# Patient Record
Sex: Male | Born: 1950 | Race: Asian | Hispanic: No | Marital: Married | Smoking: Never smoker
Health system: Southern US, Community
[De-identification: ages and names within clinical notes are randomized; demographics above are authoritative.]

## PROBLEM LIST (undated history)

## (undated) HISTORY — PX: BRAIN SURGERY: SHX531

---

## 1999-03-02 ENCOUNTER — Encounter: Payer: Self-pay | Admitting: Gastroenterology

## 1999-03-02 ENCOUNTER — Ambulatory Visit (HOSPITAL_COMMUNITY): Admission: RE | Admit: 1999-03-02 | Discharge: 1999-03-02 | Payer: Self-pay | Admitting: Gastroenterology

## 2013-02-21 ENCOUNTER — Other Ambulatory Visit: Payer: Self-pay | Admitting: Family Medicine

## 2013-02-21 DIAGNOSIS — M549 Dorsalgia, unspecified: Secondary | ICD-10-CM

## 2013-03-02 ENCOUNTER — Ambulatory Visit
Admission: RE | Admit: 2013-03-02 | Discharge: 2013-03-02 | Disposition: A | Discharge status: Home / Self Care | Payer: PRIVATE HEALTH INSURANCE | Source: Ambulatory Visit | Attending: Family Medicine | Admitting: Family Medicine

## 2013-03-02 VITALS — BP 171/103 | HR 60

## 2013-03-02 DIAGNOSIS — M549 Dorsalgia, unspecified: Secondary | ICD-10-CM

## 2013-03-02 MED ORDER — METHYLPREDNISOLONE ACETATE 40 MG/ML INJ SUSP (RADIOLOG
120.0000 mg | Freq: Once | INTRAMUSCULAR | Status: AC
  Administered 2013-03-02: 120 mg via EPIDURAL

## 2013-03-02 MED ORDER — IOHEXOL 180 MG/ML  SOLN
1.0000 mL | Freq: Once | INTRAMUSCULAR | Status: AC | PRN
  Administered 2013-03-02: 1 mL via EPIDURAL

## 2013-04-26 ENCOUNTER — Other Ambulatory Visit: Payer: Self-pay | Admitting: Family Medicine

## 2013-04-26 DIAGNOSIS — M549 Dorsalgia, unspecified: Secondary | ICD-10-CM

## 2013-05-04 ENCOUNTER — Ambulatory Visit
Admission: RE | Admit: 2013-05-04 | Discharge: 2013-05-04 | Disposition: A | Discharge status: Home / Self Care | Payer: PRIVATE HEALTH INSURANCE | Source: Ambulatory Visit | Attending: Family Medicine | Admitting: Family Medicine

## 2013-05-04 DIAGNOSIS — M549 Dorsalgia, unspecified: Secondary | ICD-10-CM

## 2013-05-04 MED ORDER — IOHEXOL 180 MG/ML  SOLN
1.0000 mL | Freq: Once | INTRAMUSCULAR | Status: AC | PRN
  Administered 2013-05-04: 1 mL via EPIDURAL

## 2013-05-04 MED ORDER — METHYLPREDNISOLONE ACETATE 40 MG/ML INJ SUSP (RADIOLOG
120.0000 mg | Freq: Once | INTRAMUSCULAR | Status: AC
  Administered 2013-05-04: 120 mg via EPIDURAL

## 2014-04-15 ENCOUNTER — Ambulatory Visit (INDEPENDENT_AMBULATORY_CARE_PROVIDER_SITE_OTHER): Payer: PRIVATE HEALTH INSURANCE | Admitting: Emergency Medicine

## 2014-04-15 ENCOUNTER — Ambulatory Visit: Payer: PRIVATE HEALTH INSURANCE

## 2014-04-15 VITALS — BP 144/99 | HR 83 | Temp 98.3°F | Resp 16 | Ht 68.0 in | Wt 185.8 lb

## 2014-04-15 DIAGNOSIS — R059 Cough, unspecified: Secondary | ICD-10-CM

## 2014-04-15 DIAGNOSIS — R05 Cough: Secondary | ICD-10-CM

## 2014-04-15 DIAGNOSIS — J209 Acute bronchitis, unspecified: Secondary | ICD-10-CM

## 2014-04-15 MED ORDER — PROMETHAZINE-CODEINE 6.25-10 MG/5ML PO SYRP: 5.0000 mL | ORAL_SOLUTION | Freq: Four times a day (QID) | ORAL | Status: DC | PRN

## 2014-04-15 MED ORDER — AZITHROMYCIN 250 MG PO TABS
ORAL_TABLET | ORAL | Status: DC
Start: 2014-04-15 — End: 2014-05-24

## 2014-04-15 NOTE — Progress Notes (Signed)
[facility] [address], [place] [address] [phone]  Date:  04/15/2014   Name:  Blake Wallace   DOB:  02/18/1951   MRN:  [id number]  PCP:  No primary provider on file.    Chief Complaint: Cough   History of Present Illness:  Blake Wallace is a 63 y.o. very pleasant male patient who presents with the following:  3 week history of cough productive mucopurulent sputum. No wheezing or shortness of breath.  No hemoptysis or history of TB.  Non smoke..  No nausea or vomiting.  No stool change.  No rash.  No coryza or sore throat.  .No improvement with over the counter medications or other home remedies. Denies other complaint or health concern today.   There are no active problems to display for this patient.   History reviewed. No pertinent past medical history.  Past Surgical History  Procedure Laterality Date  . Brain surgery      History  Substance Use Topics  . Smoking status: Never Smoker   . Smokeless tobacco: Not on file  . Alcohol Use: No    Family History  Problem Relation Age of Onset  . Family history unknown: Yes    No Known Allergies  Medication list has been reviewed and updated.  No current outpatient prescriptions on file prior to visit.   No current facility-administered medications on file prior to visit.    Review of Systems:  As per HPI, otherwise negative.    Physical Examination: Filed Vitals:   04/15/14 1811  BP: 144/99  Pulse: 83  Temp: 98.3 F (36.8 C)  Resp: 16   Filed Vitals:   04/15/14 1811  Height: 5\' 8"  (1.727 m)  Weight: 185 lb 12.8 oz (84.278 kg)   Body mass index is 28.26 kg/(m^2). Ideal Body Weight: Weight in (lb) to have BMI = 25: 164.1  GEN: WDWN, NAD, Non-toxic, A & O x 3 HEENT: Atraumatic, Normocephalic. Neck supple. No masses, No LAD. Ears and Nose: No external deformity. CV: RRR, No M/G/R. No JVD. No thrill. No extra heart sounds. PULM: CTA B, no wheezes, crackles, rhonchi. No  retractions. No resp. distress. No accessory muscle use. ABD: S, NT, ND, +BS. No rebound. No HSM. EXTR: No c/c/e NEURO Normal gait.  PSYCH: Normally interactive. Conversant. Not depressed or anxious appearing.  Calm demeanor.    Assessment and Plan: Bronchitis zpak Phen c cod  Signed,  Phillips OdorJeffery Caytlin Better, MD   [facility] reading (PRIMARY) by  Dr. Dareen PianoAnderson.  Negative chest .

## 2014-04-15 NOTE — Patient Instructions (Signed)

## 2014-05-24 ENCOUNTER — Ambulatory Visit (INDEPENDENT_AMBULATORY_CARE_PROVIDER_SITE_OTHER): Payer: PRIVATE HEALTH INSURANCE

## 2014-05-24 ENCOUNTER — Ambulatory Visit (INDEPENDENT_AMBULATORY_CARE_PROVIDER_SITE_OTHER): Payer: PRIVATE HEALTH INSURANCE | Admitting: Family Medicine

## 2014-05-24 VITALS — BP 120/84 | HR 73 | Temp 97.9°F | Resp 18 | Ht 69.0 in | Wt 185.2 lb

## 2014-05-24 DIAGNOSIS — M545 Low back pain, unspecified: Secondary | ICD-10-CM

## 2014-05-24 DIAGNOSIS — K5900 Constipation, unspecified: Secondary | ICD-10-CM

## 2014-05-24 DIAGNOSIS — M79606 Pain in leg, unspecified: Secondary | ICD-10-CM

## 2014-05-24 DIAGNOSIS — M79609 Pain in unspecified limb: Secondary | ICD-10-CM

## 2014-05-24 LAB — POCT CBC
Granulocyte percent: 53.9 %G (ref 37–80)
HCT, POC: 42.7 % — AB (ref 43.5–53.7)
HEMOGLOBIN: 13.7 g/dL — AB (ref 14.1–18.1)
LYMPH, POC: 3 (ref 0.6–3.4)
MCH, POC: 28.3 pg (ref 27–31.2)
MCHC: 32.1 g/dL (ref 31.8–35.4)
MCV: 88.2 fL (ref 80–97)
MID (CBC): 0.5 (ref 0–0.9)
MPV: 8.6 fL (ref 0–99.8)
PLATELET COUNT, POC: 280 10*3/uL (ref 142–424)
POC GRANULOCYTE: 4.1 (ref 2–6.9)
POC LYMPH PERCENT: 38.9 %L (ref 10–50)
POC MID %: 7.2 % (ref 0–12)
RBC: 4.84 M/uL (ref 4.69–6.13)
RDW, POC: 13.1 %
WBC: 7.6 10*3/uL (ref 4.6–10.2)

## 2014-05-24 LAB — POCT SEDIMENTATION RATE: POCT SED RATE: 20 mm/h (ref 0–22)

## 2014-05-24 MED ORDER — DICLOFENAC SODIUM 75 MG PO TBEC: 75.0000 mg | DELAYED_RELEASE_TABLET | Freq: Two times a day (BID) | ORAL | Status: DC

## 2014-05-24 NOTE — Patient Instructions (Addendum)
Take diclofenac one twice daily for leg pain, at breakfast and supper. Do not take any ibuprofen or Aleve while you're taking the diclofenac. If he needs something more for pain you can take Tylenol.  I will let you know the results of your lab tests in a few days.  Try the medication for about 3 weeks, and if you're not doing better come back for a recheck  You have a lot of stool (poop) back up in your colon. I suggest you try taking some over-the-counter MiraLax one dose every day or every several days to try and keep your bowels cleaned out better. The pressure from the stool can make you have more discomfort in your legs.  If you're not improving we may need to send you to another back specialist.  Return sooner if problems

## 2014-05-24 NOTE — Progress Notes (Signed)
Subjective: 63 year old [place] man who has been having pain in his legs for the last 4 years or so, gradually getting worse. He hurts in both calves and both thighs. Sometimes it hurts into the feet. He hurts worse when he is on his feet all day. If he stands in one place it hurts him. The legs will hurt in the thighs and calves, then after about a half-hour they will become numb. No back injuries. He is otherwise generally a healthy man. He takes some ibuprofen or Cong[place]inese herbal medicine for his pains. He feels better when he lays down, but in the morning when he gets out of bed he has a hard time standing up. Nonsmoker.  Objective: Pleasant alert gentleman in no major acute distress. He speaks fair [place]. Abdomen soft. No CVA tenderness. No hip or back tenderness. Gait satisfactory. Calf and thighs nontender. Good pulses in his feet. No cyanosis.  Assessment: Bilateral leg pain and numbness, suspicious for spinal stenosis  Plan: He had his back injected last year twice, but only got minimal relief for a month or so.  [facility] reading (PRIMARY) by  Dr. Alwyn RenHopper Mild arthritic spurring.  Pelvis and hips normal.  No specific cause for the pain is determined yet. I suggested he take the diclofenac on a regular basis and see how he does. If not better we may need to refer to a specialist.

## 2014-05-25 LAB — CK: Total CK: 315 U/L — ABNORMAL HIGH (ref 7–232)

## 2014-05-28 ENCOUNTER — Encounter: Payer: Self-pay | Admitting: Family Medicine

## 2014-06-14 ENCOUNTER — Ambulatory Visit (INDEPENDENT_AMBULATORY_CARE_PROVIDER_SITE_OTHER): Payer: PRIVATE HEALTH INSURANCE

## 2014-06-14 ENCOUNTER — Ambulatory Visit (INDEPENDENT_AMBULATORY_CARE_PROVIDER_SITE_OTHER): Payer: PRIVATE HEALTH INSURANCE | Admitting: Family Medicine

## 2014-06-14 VITALS — BP 140/68 | HR 70 | Temp 98.1°F | Resp 18 | Ht 69.0 in | Wt 186.0 lb

## 2014-06-14 DIAGNOSIS — M25569 Pain in unspecified knee: Secondary | ICD-10-CM

## 2014-06-14 DIAGNOSIS — M25562 Pain in left knee: Secondary | ICD-10-CM

## 2014-06-14 MED ORDER — PREDNISONE 20 MG PO TABS: ORAL_TABLET | ORAL | Status: DC

## 2014-06-14 NOTE — Progress Notes (Signed)
Subjective: Patient continues to have pain in his left leg. Hurts especially when he flexes the right knee to about 135. No specific injury. Has some pain across the low back, and there is pain in his left thigh and left leg. The right leg seems to be doing better.  Objective: Pleasant gentleman in no major distress. Speaks adequate English. No CVA tenderness. Good range of motion of back. Flexion of the knee did not seem to cause a lot of pain but it passively, but he when he does it forcefully it hurts more. She describes the pain in his lateral fly and lateral calf areas. Good pulses. Strength.. No atrophy. Straight leg raising test negative..     Assessment: Left leg pain  Plan: X-ray left knee  [facility] reading (PRIMARY) by  Dr. Alwyn RenHopper Normal knee  This may be a radicular pain from his back. We'll treat with tapered steroids. If symptoms persist may have to make another back referral for him. He apparently has seen someone and had an MRI in the past. I cannot access that report, and asked him to get hold of a copy of the report.

## 2014-06-14 NOTE — Patient Instructions (Signed)
Take prednisone with breakfast 3 pills daily for 2 days, then 2 daily for 2 days, then one daily for 2 days, then one half daily for 4 days.  If your symptoms continue to persist we will need to either get an MRI of your back will refer you to a back specialist.  Return if not improving over the next few weeks.

## 2014-07-12 ENCOUNTER — Ambulatory Visit (INDEPENDENT_AMBULATORY_CARE_PROVIDER_SITE_OTHER): Payer: PRIVATE HEALTH INSURANCE | Admitting: Emergency Medicine

## 2014-07-12 VITALS — BP 124/76 | HR 68 | Temp 98.0°F | Resp 18 | Ht 69.0 in | Wt 185.0 lb

## 2014-07-12 DIAGNOSIS — M5430 Sciatica, unspecified side: Secondary | ICD-10-CM

## 2014-07-12 DIAGNOSIS — M48061 Spinal stenosis, lumbar region without neurogenic claudication: Secondary | ICD-10-CM

## 2014-07-12 MED ORDER — TRAMADOL HCL 50 MG PO TABS: 50.0000 mg | ORAL_TABLET | Freq: Three times a day (TID) | ORAL | Status: DC | PRN

## 2014-07-12 NOTE — Progress Notes (Signed)
[facility] [address], [place] [address] [phone]  Date:  07/12/2014   Name:  Blake Wallace   DOB:  06/17/1951   MRN:  [id number]  PCP:  No PCP Per Patient    Chief Complaint: Follow-up and Leg Pain   History of Present Illness:  Blake Wallace is a 63 y.o. very pleasant male patient who presents with the following:  Patient seen previously and thought to have spinal stenosis.  Comes back today with office note from [facility].  Has been advised to have surgical treatment.  Patient not interested. Continues to have pain with radiation into left knee.  No weakness but has numbness in anterior thighs. No improvement with over the counter medications or other home remedies. Denies other complaint or health concern today.   There are no active problems to display for this patient.   History reviewed. No pertinent past medical history.  Past Surgical History  Procedure Laterality Date  . Brain surgery      History  Substance Use Topics  . Smoking status: Never Smoker   . Smokeless tobacco: Not on file  . Alcohol Use: No    History reviewed. No pertinent family history.  No Known Allergies  Medication list has been reviewed and updated.  Current Outpatient Prescriptions on File Prior to Visit  Medication Sig Dispense Refill  . diclofenac (VOLTAREN) 75 MG EC tablet Take 1 tablet (75 mg total) by mouth 2 (two) times daily.  40 tablet  1  . ibuprofen (ADVIL,MOTRIN) 200 MG tablet Take 200 mg by mouth every 6 (six) hours as needed.      . predniSONE (DELTASONE) 20 MG tablet Take 3 daily for 2 days, then 2 daily for 2 days, then one daily for 2 days, then one half daily for 4 days. Take with breakfast.  14 tablet  0   No current facility-administered medications on file prior to visit.    Review of Systems:  As per HPI, otherwise negative.    Physical Examination: Filed Vitals:   07/12/14 1211  BP: 124/76  Pulse: 68  Temp: 98 F (36.7  C)  Resp: 18   Filed Vitals:   07/12/14 1211  Height: 5\' 9"  (1.753 m)  Weight: 185 lb (83.915 kg)   Body mass index is 27.31 kg/(m^2). Ideal Body Weight: Weight in (lb) to have BMI = 25: 168.9   GEN: WDWN, NAD, Non-toxic, Alert & Oriented x 3 HEENT: Atraumatic, Normocephalic.  Ears and Nose: No external deformity. EXTR: No clubbing/cyanosis/edema NEURO: Normal gait.  Absent left patellar.  Normal motor strength PSYCH: Normally interactive. Conversant. Not depressed or anxious appearing.  Calm demeanor.    Assessment and Plan: Sciatic neuritis likely secondary to spinal stenosis Refer back to [facility]  ? Refer to pain management Tramadol  Signed,  Phillips OdorJeffery Rakiyah Esch, MD

## 2014-07-12 NOTE — Patient Instructions (Signed)
Spinal Stenosis Spinal stenosis is an abnormal narrowing of the canals of your spine (vertebrae). CAUSES  Spinal stenosis is caused by areas of bone pushing into the central canals of your vertebrae. This condition can be present at birth (congenital). It also may be caused by arthritic deterioration of your vertebrae (spinal degeneration).  SYMPTOMS   Pain that is generally worse with activities, particularly standing and walking.  Numbness, tingling, hot or cold sensations, weakness, or weariness in your legs.  Frequent episodes of falling.  A foot-slapping gait that leads to muscle weakness. DIAGNOSIS  Spinal stenosis is diagnosed with the use of magnetic resonance imaging (MRI) or computed tomography (CT). TREATMENT  Initial therapy for spinal stenosis focuses on the management of the pain and other symptoms associated with the condition. These therapies include:  Practicing postural changes to lessen pressure on your nerves.  Exercises to strengthen the core of your body.  Loss of excess body weight.  The use of nonsteroidal anti-inflammatory medicines to reduce swelling and inflammation in your nerves. When therapies to manage pain are not successful, surgery to treat spinal stenosis may be recommended. This surgery involves removing excess bone, which puts pressure on your nerve roots. During this surgery (laminectomy), the posterior boney arch (lamina) and excess bone around the facet joints are removed. Document Released: 02/12/2004 Document Revised: 04/08/2014 Document Reviewed: 03/02/2013 ExitCare Patient Information 2015 [employer]. This information is not intended to replace advice given to you by your health care provider. Make sure you discuss any questions you have with your health care provider.  

## 2014-08-20 IMAGING — CR DG LUMBAR SPINE COMPLETE 4+V
5 series · 5 of 5 positions shown · non-contrast
Comparison: None.

CLINICAL DATA: Chronic leg pain, worsening.

EXAM:
LUMBAR SPINE - COMPLETE 4+ VIEW

[AP]
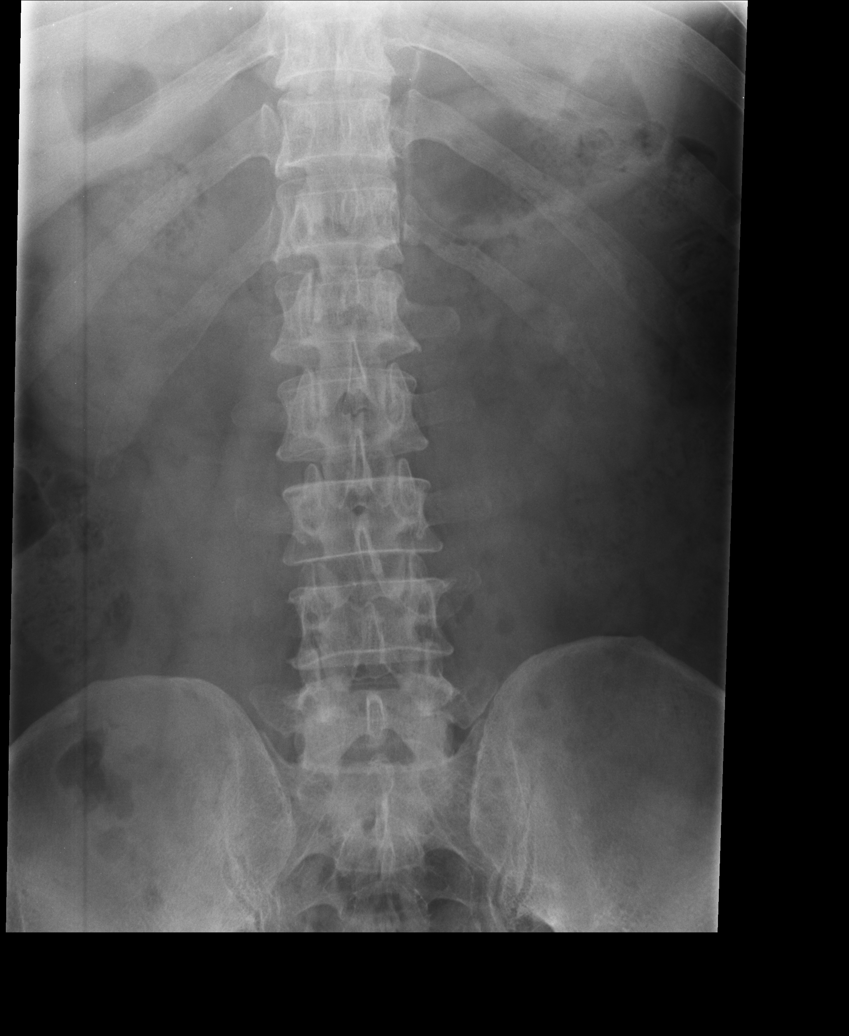

[rpo]
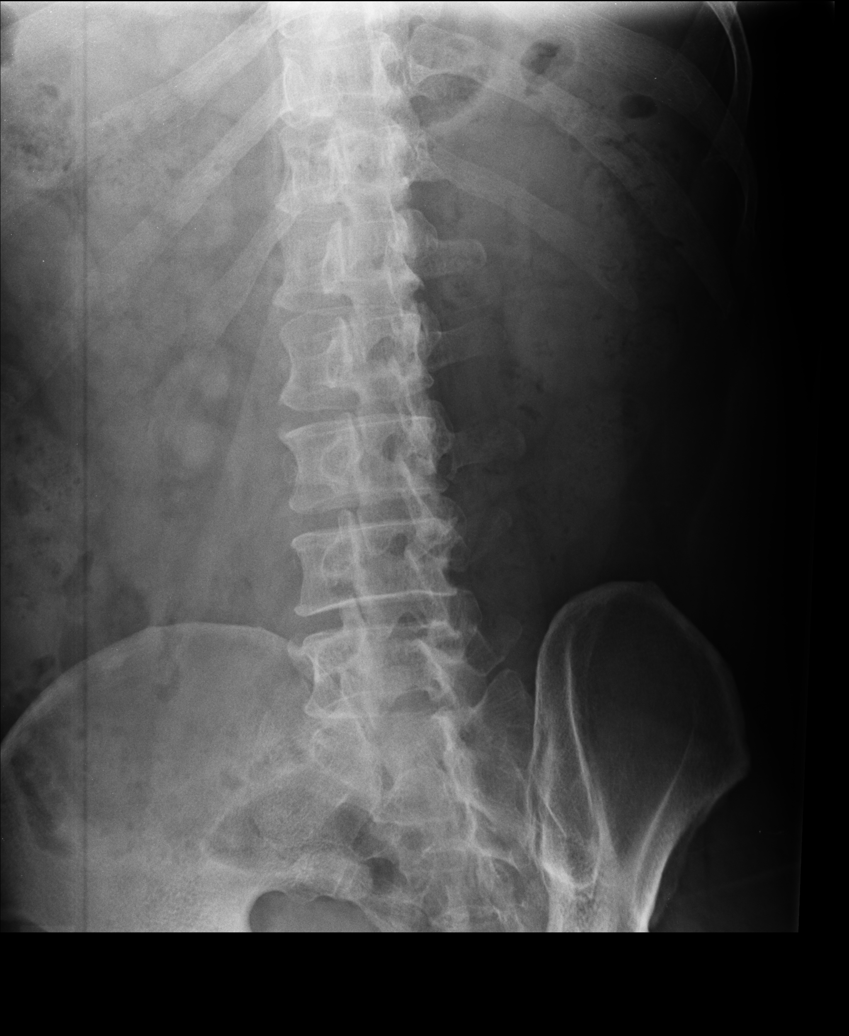

[lpo]
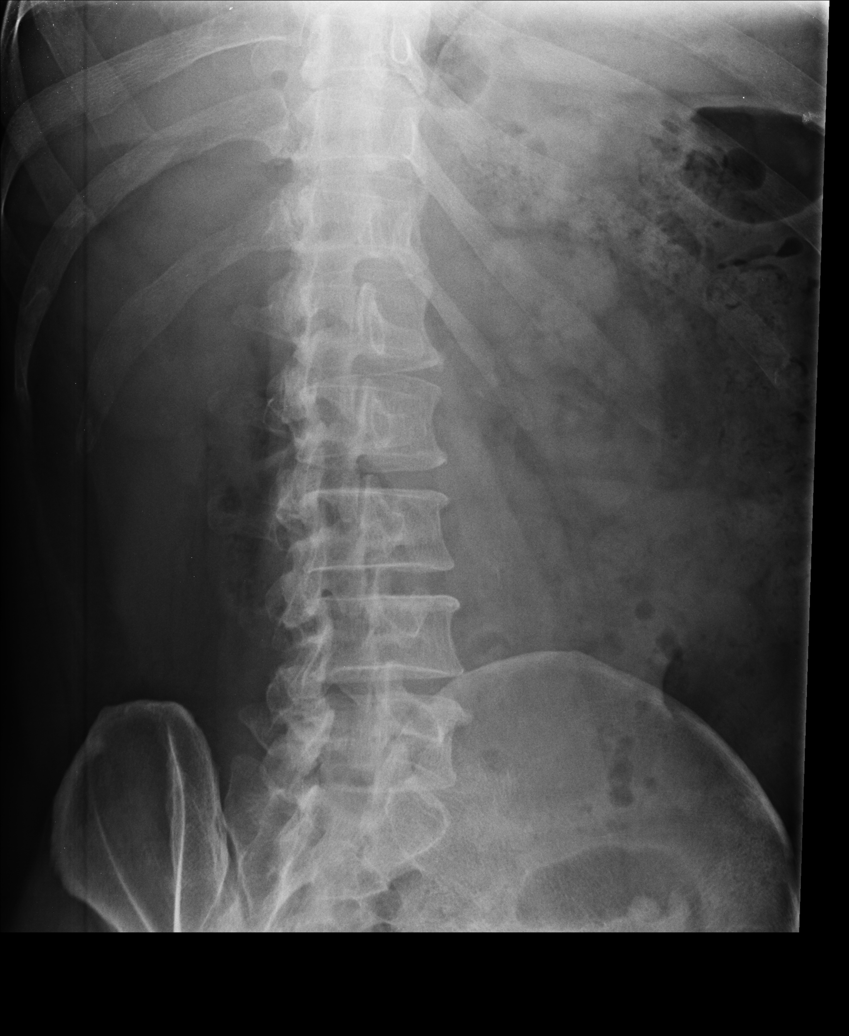

[lateral]
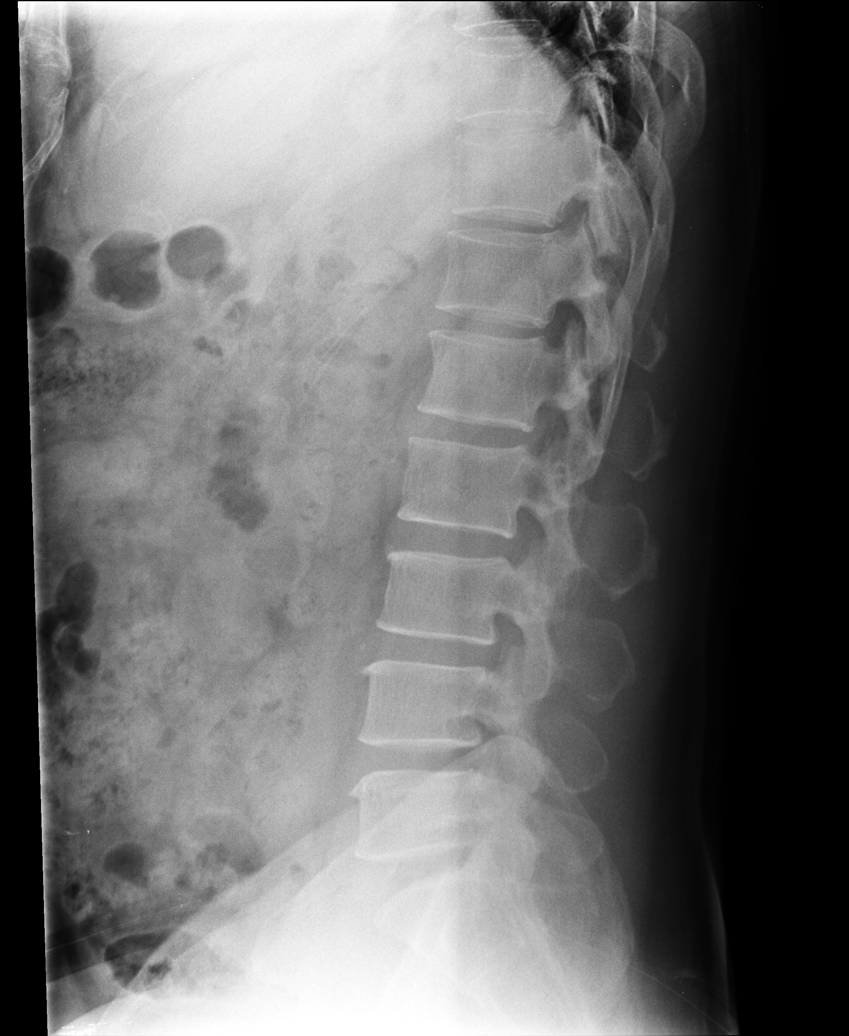

[l5 s1]
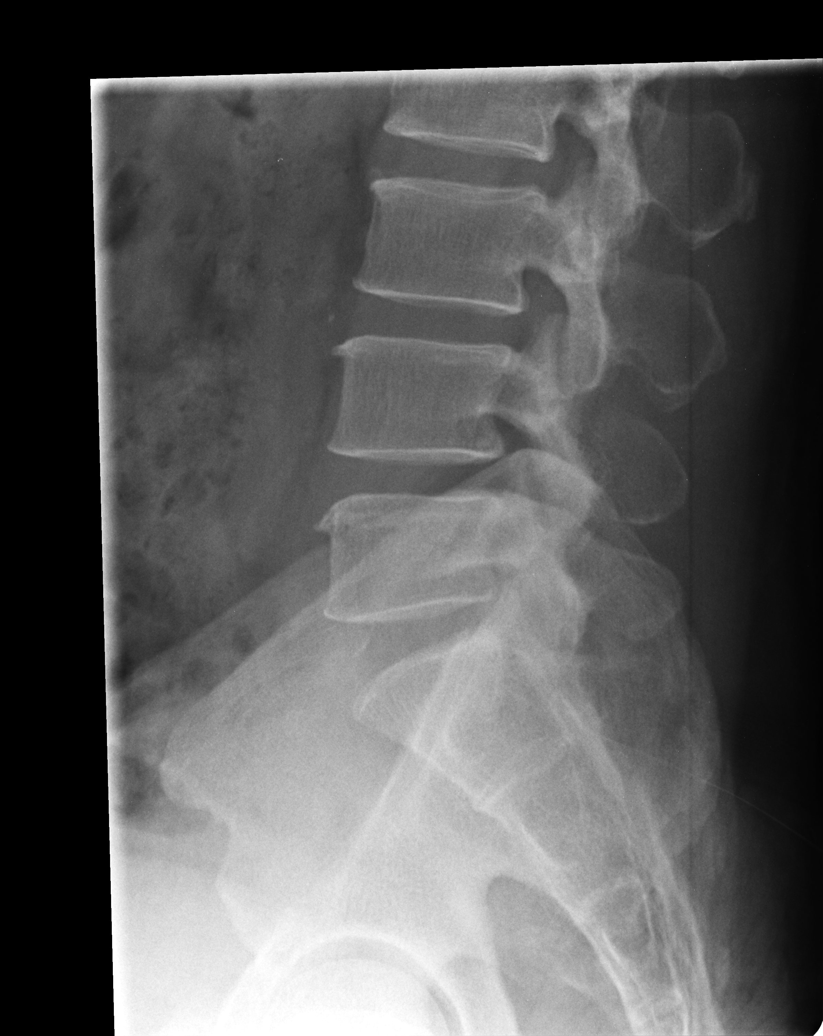

[5 of 5 positions shown; findings below may reference images not displayed]

FINDINGS: Minimal spondylosis, with spurring anterior to the vertebral body
column at L2-3, L3-4, and L4-5.

No fracture or malalignment. No additional significant abnormalities
observed.
IMPRESSION: 1. Minimal lumbar spondylosis.

## 2014-08-31 ENCOUNTER — Ambulatory Visit (INDEPENDENT_AMBULATORY_CARE_PROVIDER_SITE_OTHER): Payer: PRIVATE HEALTH INSURANCE | Admitting: Family Medicine

## 2014-08-31 VITALS — BP 128/78 | HR 63 | Temp 97.8°F | Resp 16 | Ht 69.0 in | Wt 180.0 lb

## 2014-08-31 DIAGNOSIS — M5442 Lumbago with sciatica, left side: Secondary | ICD-10-CM

## 2014-08-31 DIAGNOSIS — R059 Cough, unspecified: Secondary | ICD-10-CM

## 2014-08-31 DIAGNOSIS — J069 Acute upper respiratory infection, unspecified: Secondary | ICD-10-CM

## 2014-08-31 DIAGNOSIS — R21 Rash and other nonspecific skin eruption: Secondary | ICD-10-CM

## 2014-08-31 DIAGNOSIS — M5441 Lumbago with sciatica, right side: Secondary | ICD-10-CM

## 2014-08-31 DIAGNOSIS — R05 Cough: Secondary | ICD-10-CM

## 2014-08-31 DIAGNOSIS — M5430 Sciatica, unspecified side: Secondary | ICD-10-CM

## 2014-08-31 MED ORDER — BENZONATATE 100 MG PO CAPS: 200.0000 mg | ORAL_CAPSULE | Freq: Two times a day (BID) | ORAL | Status: DC | PRN

## 2014-08-31 MED ORDER — TRAMADOL HCL 50 MG PO TABS: 50.0000 mg | ORAL_TABLET | Freq: Two times a day (BID) | ORAL | Status: DC | PRN

## 2014-08-31 MED ORDER — TRIAMCINOLONE ACETONIDE 0.1 % EX CREA: 1.0000 "application " | TOPICAL_CREAM | Freq: Two times a day (BID) | CUTANEOUS | Status: DC

## 2014-08-31 NOTE — Progress Notes (Signed)
Chief Complaint:  Chief Complaint  Patient presents with  . Cough    x6 days; pt has an cough and he states his phlegm is dark color  . Leg Problem    pt states both of his legs are itching  . Medication Refill    TraMADol    HPI: Blake Wallace is a 63 y.o. male who is here for  1. 1 week cough with hx of sputum without blood. No fevers chills or night sweat,  Coughing up dark yellow sputum. No blood in couhg, no h/o TB. No facial pain, no ear pain, no drainage, no SOB, no CP. He has minimal pharyngitis 2. He also has had dry sckin and eczema on his legs for the last 3 weeks, has been scratching  his legs. dneies nay new meds, any new soaps, foods, travels, insect bites 3. He has chronic low back pain with sciatica,  numbness and tingling in his legs when he stands or walks tooo far, he has sciatica and LBP, he denies diabetes. No new w/n/t  Prior back exam shows: FINDINGS:  Minimal spondylosis, with spurring anterior to the vertebral body  column at L2-3, L3-4, and L4-5.  No fracture or malalignment. No additional significant abnormalities  observed.  IMPRESSION:  1. Minimal lumbar spondylosis.  Electronically Signed  By: Herbie Baltimore M.D.  On: 05/24/2014 14:44   History reviewed. No pertinent past medical history. Past Surgical History  Procedure Laterality Date  . Brain surgery     History   Social History  . Marital Status: Married    Spouse Name: N/A    Number of Children: N/A  . Years of Education: N/A   Social History Main Topics  . Smoking status: Never Smoker   . Smokeless tobacco: None  . Alcohol Use: No  . Drug Use: No  . Sexual Activity: None   Other Topics Concern  . None   Social History Narrative  . None   History reviewed. No pertinent family history. No Known Allergies Prior to Admission medications   Medication Sig Start Date End Date Taking? Authorizing Provider  diclofenac (VOLTAREN) 75 MG EC tablet Take 1 tablet (75 mg  total) by mouth 2 (two) times daily. 05/24/14  Yes Peyton Najjar, MD  ibuprofen (ADVIL,MOTRIN) 200 MG tablet Take 200 mg by mouth every 6 (six) hours as needed.   Yes Historical Provider, MD  predniSONE (DELTASONE) 20 MG tablet Take 3 daily for 2 days, then 2 daily for 2 days, then one daily for 2 days, then one half daily for 4 days. Take with breakfast. 06/14/14  Yes Peyton Najjar, MD  traMADol (ULTRAM) 50 MG tablet Take 1 tablet (50 mg total) by mouth every 8 (eight) hours as needed. 07/12/14  Yes Carmelina Dane, MD     ROS: The patient denies fevers, chills, night sweats, unintentional weight loss, chest pain, palpitations, wheezing, dyspnea on exertion, nausea, vomiting, abdominal pain, dysuria, hematuria, melena,   All other systems have been reviewed and were otherwise negative with the exception of those mentioned in the HPI and as above.    PHYSICAL EXAM: Filed Vitals:   08/31/14 0924  BP: 128/78  Pulse: 63  Temp: 97.8 F (36.6 C)  Resp: 16   Filed Vitals:   08/31/14 0924  Height:  (1.753 m)  Weight: 180 lb (81.647 kg)   Body mass index is 26.57 kg/(m^2).  General: Alert, no acute distress HEENT:  Normocephalic,  atraumatic, oropharynx patent. EOMI, PERRLA Cardiovascular:  Regular rate and rhythm, no rubs murmurs or gallops.  No Carotid bruits, radial pulse intact. No pedal edema.  Respiratory: Clear to auscultation bilaterally.  No wheezes, rales, or rhonchi.  No cyanosis, no use of accessory musculature GI: No organomegaly, abdomen is soft and non-tender, positive bowel sounds.  No masses. Skin: No rashes. Neurologic: Facial musculature symmetric. Psychiatric: Patient is appropriate throughout our interaction. Lymphatic: No cervical lymphadenopathy Musculoskeletal: Gait intact. + paramsk tenderness  Decrease ROM due topain 5/5 strength, 2/2 DTRs No saddle anesthesia Straight leg +/- Hip and knee exam-- grossly normal    LABS: Results for orders placed  in visit on 05/24/14  CK      Result Value Ref Range   Total CK 315 (*) 7 - 232 U/L  POCT CBC      Result Value Ref Range   WBC 7.6  4.6 - 10.2 K/uL   Lymph, poc 3.0  0.6 - 3.4   POC LYMPH PERCENT 38.9  10 - 50 %L   MID (cbc) 0.5  0 - 0.9   POC MID % 7.2  0 - 12 %M   POC Granulocyte 4.1  2 - 6.9   Granulocyte percent 53.9  37 - 80 %G   RBC 4.84  4.69 - 6.13 M/uL   Hemoglobin 13.7 (*) 14.1 - 18.1 g/dL   HCT, POC 62.9 (*) 52.8 - 53.7 %   MCV 88.2  80 - 97 fL   MCH, POC 28.3  27 - 31.2 pg   MCHC 32.1  31.8 - 35.4 g/dL   RDW, POC 41.3     Platelet Count, POC 280  142 - 424 K/uL   MPV 8.6  0 - 99.8 fL  POCT SEDIMENTATION RATE      Result Value Ref Range   POCT SED RATE 20  0 - 22 mm/hr     EKG/XRAY:   Primary read interpreted by Dr. Conley Rolls at [facility].   ASSESSMENT/PLAN: Encounter Diagnoses  Name Primary?  . Rash and nonspecific skin eruption Yes  . Bilateral low back pain with sciatica, sciatica laterality unspecified   . Cough   . Acute upper respiratory infections of unspecified site    Rx Tessalon perles, if no improvement then will give him abx Rx Tramadol for pain, advise to decrease to  Bid instead of TID, he may need referral to chronic pain management but I am not sure he understands what pain management is, he states he has arthritis and the tramadol works and he is not abusing the medicines, no SEs Rx Triamcinolone cream for eczema, advise to use lotionand stop scratching Forgot to ask if patient wanted flu vaccine during OV Will call to remind him with labs F/u prn    Gross sideeffects, risk and benefits, and alternatives of medications d/w patient. Patient is aware that all medications have potential sideeffects and we are unable to predict every sideeffect or drug-drug interaction that may occur.  Leroy Trim PHUONG, DO 08/31/2014 10:42 AM

## 2015-02-06 ENCOUNTER — Ambulatory Visit (INDEPENDENT_AMBULATORY_CARE_PROVIDER_SITE_OTHER): Payer: Commercial Managed Care - PPO | Admitting: Family Medicine

## 2015-02-06 ENCOUNTER — Ambulatory Visit (INDEPENDENT_AMBULATORY_CARE_PROVIDER_SITE_OTHER): Payer: Commercial Managed Care - PPO

## 2015-02-06 VITALS — BP 130/90 | HR 74 | Temp 97.6°F | Resp 20 | Ht 69.0 in | Wt 185.4 lb

## 2015-02-06 DIAGNOSIS — R059 Cough, unspecified: Secondary | ICD-10-CM

## 2015-02-06 DIAGNOSIS — J069 Acute upper respiratory infection, unspecified: Secondary | ICD-10-CM

## 2015-02-06 DIAGNOSIS — R05 Cough: Secondary | ICD-10-CM

## 2015-02-06 DIAGNOSIS — J029 Acute pharyngitis, unspecified: Secondary | ICD-10-CM

## 2015-02-06 LAB — POCT CBC
Granulocyte percent: 53 %G (ref 37–80)
HEMATOCRIT: 44 % (ref 43.5–53.7)
Hemoglobin: 14.1 g/dL (ref 14.1–18.1)
LYMPH, POC: 4 — AB (ref 0.6–3.4)
MCH, POC: 28 pg (ref 27–31.2)
MCHC: 31.9 g/dL (ref 31.8–35.4)
MCV: 87.8 fL (ref 80–97)
MID (CBC): 0.5 (ref 0–0.9)
MPV: 7.1 fL (ref 0–99.8)
POC Granulocyte: 5.1 (ref 2–6.9)
POC LYMPH PERCENT: 41.6 %L (ref 10–50)
POC MID %: 5.4 % (ref 0–12)
Platelet Count, POC: 272 10*3/uL (ref 142–424)
RBC: 5.01 M/uL (ref 4.69–6.13)
RDW, POC: 13.9 %
WBC: 9.6 10*3/uL (ref 4.6–10.2)

## 2015-02-06 MED ORDER — AMOXICILLIN-POT CLAVULANATE 875-125 MG PO TABS: 1.0000 | ORAL_TABLET | Freq: Two times a day (BID) | ORAL | Status: DC

## 2015-02-06 MED ORDER — MUCINEX DM MAXIMUM STRENGTH 60-1200 MG PO TB12: 1.0000 | ORAL_TABLET | Freq: Two times a day (BID) | ORAL | Status: DC

## 2015-02-06 NOTE — Progress Notes (Addendum)
Patient ID: Blake Wallace, male   DOB: 1951-04-21, 64 y.o.   MRN: [id number]   Subjective:  This chart was scribed for Nilda Simmer, MD by Haywood Pao, ED Scribe at [facility].The patient was seen in exam room 02 and the patient's care was started at 6:50 PM.   Patient ID: Blake Wallace, male    DOB: 03-Aug-1951, 64 y.o.   MRN: [id number]  02/06/2015  Cough  HPI  HPI Comments: Blake Wallace is a 64 y.o. male who presents to [facility] complaining of cough onset two weeks ago. The cough is producing a dark phlegm in the morning. He has had an iIntermittent sore throat for the past 3 days and sinus congestion as associated symptoms. He denies fever, chills, diaphoresis, HA, rhinorrhea, leg swelling, or weight loss. 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 Pt does not smoke or drink. 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 He is from [place] and has been living in the [place] for 31 years; visited [place] one year ago; no other recent travel. Pt is divorced has 4 kids, 2 grand kids and lives alone.  Review of Systems  Constitutional: Negative for fever, chills, diaphoresis, activity change, appetite change, fatigue and unexpected weight change.  HENT: Positive for congestion, rhinorrhea and sore throat. Negative for ear pain, postnasal drip and sinus pressure.   Respiratory: Positive for cough. Negative for shortness of breath and wheezing.   Cardiovascular: Negative for chest pain, palpitations and leg swelling.  Gastrointestinal: Negative for nausea, vomiting, abdominal pain and diarrhea.  Endocrine: Negative for cold intolerance, heat intolerance, polydipsia, polyphagia and polyuria.  Skin: Negative for color change, rash and wound.  Neurological: Negative for dizziness, tremors, seizures, syncope, facial asymmetry, speech difficulty, weakness, light-headedness, numbness and headaches.  Psychiatric/Behavioral: Negative for sleep disturbance and  dysphoric mood. The patient is not nervous/anxious.    History reviewed. No pertinent past medical history. Past Surgical History  Procedure Laterality Date   Brain surgery     No Known Allergies     Objective:    BP 130/90 mmHg   Pulse 74   Temp(Src) 97.6 F (36.4 C) (Oral)   Resp 20   Ht  (1.753 m)   Wt 185 lb 6 oz (84.086 kg)   BMI 27.36 kg/m2   SpO2 99% Physical Exam  Constitutional: He is oriented to person, place, and time. He appears well-developed and well-nourished. No distress.  HENT:  Head: Normocephalic and atraumatic.  Diffuse erythema in throat.  Eyes: Conjunctivae and EOM are normal. Pupils are equal, round, and reactive to light.  Neck: Normal range of motion. Neck supple. Carotid bruit is not present. No thyromegaly present.  Cardiovascular: Normal rate, regular rhythm, normal heart sounds and intact distal pulses.  Exam reveals no gallop and no friction rub.   No murmur heard. Pulmonary/Chest: Effort normal and breath sounds normal. No respiratory distress. He has no wheezes. He has no rales.  No Rhonchi.  Abdominal: Soft. There is no tenderness. There is no rebound and no guarding.  Musculoskeletal: Normal range of motion. He exhibits no edema.  No lower extremity edema.  Lymphadenopathy:    He has no cervical adenopathy.  Neurological: He is alert and oriented to person, place, and time. No cranial nerve deficit.  Skin: Skin is warm and dry. No rash noted. He is not diaphoretic.  Psychiatric: He has a normal mood and affect. His behavior  is normal.  Nursing note and vitals reviewed.  Results for orders placed or performed in visit on 02/06/15  POCT CBC  Result Value Ref Range   WBC 9.6 4.6 - 10.2 K/uL   Lymph, poc 4.0 (A) 0.6 - 3.4   POC LYMPH PERCENT 41.6 10 - 50 %L   MID (cbc) 0.5 0 - 0.9   POC MID % 5.4 0 - 12 %M   POC Granulocyte 5.1 2 - 6.9   Granulocyte percent 53.0 37 - 80 %G   RBC 5.01 4.69 - 6.13 M/uL   Hemoglobin 14.1 14.1 - 18.1 g/dL     HCT, POC 40.944.0 81.143.5 - 53.7 %   MCV 87.8 80 - 97 fL   MCH, POC 28.0 27 - 31.2 pg   MCHC 31.9 31.8 - 35.4 g/dL   RDW, POC 91.413.9 %   Platelet Count, POC 272 142 - 424 K/uL   MPV 7.1 0 - 99.8 fL      [facility] reading (PRIMARY) by  Dr. Katrinka BlazingSmith.  CXR: NAD   Assessment & Plan:   1. Cough   2. Sore throat   3. Acute upper respiratory infection    -New.   -Due to duration and language barrier, treat with Augmentin, Mucinex. -RTC for acute worsening.   Meds ordered this encounter  Medications   acetaminophen (TYLENOL) 650 MG CR tablet    Sig: Take 650 mg by mouth every 8 (eight) hours as needed for pain.   amoxicillin-clavulanate (AUGMENTIN) 875-125 MG per tablet    Sig: Take 1 tablet by mouth 2 (two) times daily.    Dispense:  20 tablet    Refill:  0   Dextromethorphan-Guaifenesin (MUCINEX DM MAXIMUM STRENGTH) 60-1200 MG TB12    Sig: Take 1 tablet by mouth every 12 (twelve) hours.    Dispense:  20 each    Refill:  0    No Follow-up on file.    I personally performed the services described in this documentation, which was scribed in my presence. The recorded information has been reviewed and considered.  Kristi Paulita FujitaMartin Smith, M.D. [facility]   [place] [address] [place], [place]  [address] [phone] phone [phone] fax

## 2015-02-06 NOTE — Patient Instructions (Signed)
Upper Respiratory Infection, Adult An upper respiratory infection (URI) is also sometimes known as the common cold. The upper respiratory tract includes the nose, sinuses, throat, trachea, and bronchi. Bronchi are the airways leading to the lungs. Most people improve within 1 week, but symptoms can last up to 2 weeks. A residual cough may last even longer.  CAUSES Many different viruses can infect the tissues lining the upper respiratory tract. The tissues become irritated and inflamed and often become very moist. Mucus production is also common. A cold is contagious. You can easily spread the virus to others by oral contact. This includes kissing, sharing a glass, coughing, or sneezing. Touching your mouth or nose and then touching a surface, which is then touched by another person, can also spread the virus. SYMPTOMS  Symptoms typically develop 1 to 3 days after you come in contact with a cold virus. Symptoms vary from person to person. They may include:  Runny nose.  Sneezing.  Nasal congestion.  Sinus irritation.  Sore throat.  Loss of voice (laryngitis).  Cough.  Fatigue.  Muscle aches.  Loss of appetite.  Headache.  Low-grade fever. DIAGNOSIS  You might diagnose your own cold based on familiar symptoms, since most people get a cold 2 to 3 times a year. Your caregiver can confirm this based on your exam. Most importantly, your caregiver can check that your symptoms are not due to another disease such as strep throat, sinusitis, pneumonia, asthma, or epiglottitis. Blood tests, throat tests, and X-rays are not necessary to diagnose a common cold, but they may sometimes be helpful in excluding other more serious diseases. Your caregiver will decide if any further tests are required. RISKS AND COMPLICATIONS  You may be at risk for a more severe case of the common cold if you smoke cigarettes, have chronic heart disease (such as heart failure) or lung disease (such as asthma), or if  you have a weakened immune system. The very young and very old are also at risk for more serious infections. Bacterial sinusitis, middle ear infections, and bacterial pneumonia can complicate the common cold. The common cold can worsen asthma and chronic obstructive pulmonary disease (COPD). Sometimes, these complications can require emergency medical care and may be life-threatening. PREVENTION  The best way to protect against getting a cold is to practice good hygiene. Avoid oral or hand contact with people with cold symptoms. Wash your hands often if contact occurs. There is no clear evidence that vitamin C, vitamin E, echinacea, or exercise reduces the chance of developing a cold. However, it is always recommended to get plenty of rest and practice good nutrition. TREATMENT  Treatment is directed at relieving symptoms. There is no cure. Antibiotics are not effective, because the infection is caused by a virus, not by bacteria. Treatment may include:  Increased fluid intake. Sports drinks offer valuable electrolytes, sugars, and fluids.  Breathing heated mist or steam (vaporizer or shower).  Eating chicken soup or other clear broths, and maintaining good nutrition.  Getting plenty of rest.  Using gargles or lozenges for comfort.  Controlling fevers with ibuprofen or acetaminophen as directed by your caregiver.  Increasing usage of your inhaler if you have asthma. Zinc gel and zinc lozenges, taken in the first 24 hours of the common cold, can shorten the duration and lessen the severity of symptoms. Pain medicines may help with fever, muscle aches, and throat pain. A variety of non-prescription medicines are available to treat congestion and runny nose. Your caregiver   can make recommendations and may suggest nasal or lung inhalers for other symptoms.  HOME CARE INSTRUCTIONS   Only take over-the-counter or prescription medicines for pain, discomfort, or fever as directed by your  caregiver.  Use a warm mist humidifier or inhale steam from a shower to increase air moisture. This may keep secretions moist and make it easier to breathe.  Drink enough water and fluids to keep your urine clear or pale yellow.  Rest as needed.  Return to work when your temperature has returned to normal or as your caregiver advises. You may need to stay home longer to avoid infecting others. You can also use a face mask and careful hand washing to prevent spread of the virus. SEEK MEDICAL CARE IF:   After the first few days, you feel you are getting worse rather than better.  You need your caregiver's advice about medicines to control symptoms.  You develop chills, worsening shortness of breath, or brown or red sputum. These may be signs of pneumonia.  You develop yellow or brown nasal discharge or pain in the face, especially when you bend forward. These may be signs of sinusitis.  You develop a fever, swollen neck glands, pain with swallowing, or white areas in the back of your throat. These may be signs of strep throat. SEEK IMMEDIATE MEDICAL CARE IF:   You have a fever.  You develop severe or persistent headache, ear pain, sinus pain, or chest pain.  You develop wheezing, a prolonged cough, cough up blood, or have a change in your usual mucus (if you have chronic lung disease).  You develop sore muscles or a stiff neck. Document Released: 05/18/2001 Document Revised: 02/14/2012 Document Reviewed: 02/27/2014 [employer] Patient Information 2015 [employer]. This information is not intended to replace advice given to you by your health care provider. Make sure you discuss any questions you have with your health care provider.  

## 2015-11-15 ENCOUNTER — Ambulatory Visit (INDEPENDENT_AMBULATORY_CARE_PROVIDER_SITE_OTHER): Payer: Commercial Managed Care - PPO | Admitting: Physician Assistant

## 2015-11-15 VITALS — BP 136/88 | HR 75 | Temp 98.1°F | Resp 18 | Ht 69.0 in | Wt 189.4 lb

## 2015-11-15 DIAGNOSIS — R059 Cough, unspecified: Secondary | ICD-10-CM

## 2015-11-15 DIAGNOSIS — R05 Cough: Secondary | ICD-10-CM

## 2015-11-15 DIAGNOSIS — J209 Acute bronchitis, unspecified: Secondary | ICD-10-CM

## 2015-11-15 MED ORDER — HYDROCOD POLST-CPM POLST ER 10-8 MG/5ML PO SUER: 5.0000 mL | Freq: Every evening | ORAL | Status: DC | PRN

## 2015-11-15 MED ORDER — AZITHROMYCIN 250 MG PO TABS: ORAL_TABLET | ORAL | Status: DC

## 2015-11-15 NOTE — Patient Instructions (Signed)
Please hydrate well with 64 oz of water (almost 4 regular sized water bottles) Continue the mucinex DM. You can use the delsym for daytime cough.  Acute Bronchitis Bronchitis is inflammation of the airways that extend from the windpipe into the lungs (bronchi). The inflammation often causes mucus to develop. This leads to a cough, which is the most common symptom of bronchitis.  In acute bronchitis, the condition usually develops suddenly and goes away over time, usually in a couple weeks. Smoking, allergies, and asthma can make bronchitis worse. Repeated episodes of bronchitis may cause further lung problems.  CAUSES Acute bronchitis is most often caused by the same virus that causes a cold. The virus can spread from person to person (contagious) through coughing, sneezing, and touching contaminated objects. SIGNS AND SYMPTOMS   Cough.   Fever.   Coughing up mucus.   Body aches.   Chest congestion.   Chills.   Shortness of breath.   Sore throat.  DIAGNOSIS  Acute bronchitis is usually diagnosed through a physical exam. Your health care provider will also ask you questions about your medical history. Tests, such as chest X-rays, are sometimes done to rule out other conditions.  TREATMENT  Acute bronchitis usually goes away in a couple weeks. Oftentimes, no medical treatment is necessary. Medicines are sometimes given for relief of fever or cough. Antibiotic medicines are usually not needed but may be prescribed in certain situations. In some cases, an inhaler may be recommended to help reduce shortness of breath and control the cough. A cool mist vaporizer may also be used to help thin bronchial secretions and make it easier to clear the chest.  HOME CARE INSTRUCTIONS  Get plenty of rest.   Drink enough fluids to keep your urine clear or pale yellow (unless you have a medical condition that requires fluid restriction). Increasing fluids may help thin your respiratory  secretions (sputum) and reduce chest congestion, and it will prevent dehydration.   Take medicines only as directed by your health care provider.  If you were prescribed an antibiotic medicine, finish it all even if you start to feel better.  Avoid smoking and secondhand smoke. Exposure to cigarette smoke or irritating chemicals will make bronchitis worse. If you are a smoker, consider using nicotine gum or skin patches to help control withdrawal symptoms. Quitting smoking will help your lungs heal faster.   Reduce the chances of another bout of acute bronchitis by washing your hands frequently, avoiding people with cold symptoms, and trying not to touch your hands to your mouth, nose, or eyes.   Keep all follow-up visits as directed by your health care provider.  SEEK MEDICAL CARE IF: Your symptoms do not improve after 1 week of treatment.  SEEK IMMEDIATE MEDICAL CARE IF:  You develop an increased fever or chills.   You have chest pain.   You have severe shortness of breath.  You have bloody sputum.   You develop dehydration.  You faint or repeatedly feel like you are going to pass out.  You develop repeated vomiting.  You develop a severe headache. MAKE SURE YOU:   Understand these instructions.  Will watch your condition.  Will get help right away if you are not doing well or get worse.   This information is not intended to replace advice given to you by your health care provider. Make sure you discuss any questions you have with your health care provider.   Document Released: 12/30/2004 Document Revised: 12/13/2014 Document Reviewed:  05/15/2013 Elsevier Interactive Patient Education [employer].

## 2015-11-15 NOTE — Progress Notes (Signed)
[facility] [address] [place] [address] [phone]  Date:  11/15/2015   Name:  Blake Wallace   DOB:  [birth year]   MRN:  [id number]  PCP:  No PCP Per Patient   Chief Complaint  Patient presents with  . Cough    x1 week, chest hurts when coughing. semi-productive cough      History of Present Illness:  Blake Wallace is a 64 y.o. male patient who presents to [facility] for cough for 1 week.    Intermittent productive cough that appears to be worsening.  There is mild nasal congestion and rhinorrhea.  He has take a little mucinex but not to much avail.  He denies fever, sob, or dyspnea.    There are no active problems to display for this patient.   History reviewed. No pertinent past medical history.  Past Surgical History  Procedure Laterality Date  . Brain surgery      Social History  Substance Use Topics  . Smoking status: Never Smoker   . Smokeless tobacco: Never Used  . Alcohol Use: No    History reviewed. No pertinent family history.  No Known Allergies  Medication list has been reviewed and updated.  Current Outpatient Prescriptions on File Prior to Visit  Medication Sig Dispense Refill  . acetaminophen (TYLENOL) 650 MG CR tablet Take 650 mg by mouth every 8 (eight) hours as needed for pain.    [name] amoxicillin-clavulanate (AUGMENTIN) 875-125 MG per tablet Take 1 tablet by mouth 2 (two) times daily. (Patient not taking: Reported on 11/15/2015) 20 tablet 0  . Dextromethorphan-Guaifenesin (MUCINEX DM MAXIMUM STRENGTH) 60-1200 MG TB12 Take 1 tablet by mouth every 12 (twelve) hours. (Patient not taking: Reported on 11/15/2015) 20 each 0   No current facility-administered medications on file prior to visit.    ROS ROS otherwise unremarkable unless listed above.   Physical Examination: BP 136/88 mmHg  Pulse 75  Temp(Src) 98.1 F (36.7 C) (Oral)  Resp 18  Ht  (1.753 m)  Wt 189 lb 6.4 oz (85.911 kg)  BMI 27.96 kg/m2  SpO2 98% Ideal  Body Weight: Weight in (lb) to have BMI = 25: 168.9  Physical Exam  Constitutional: He is oriented to person, place, and time. He appears well-developed and well-nourished. No distress.  HENT:  Head: Atraumatic.  Right Ear: Tympanic membrane, external ear and ear canal normal.  Left Ear: Tympanic membrane, external ear and ear canal normal.  Nose: Rhinorrhea present. Right sinus exhibits no maxillary sinus tenderness and no frontal sinus tenderness. Left sinus exhibits no maxillary sinus tenderness and no frontal sinus tenderness.  Mouth/Throat: No uvula swelling. No oropharyngeal exudate, posterior oropharyngeal edema or posterior oropharyngeal erythema.  Eyes: Conjunctivae, EOM and lids are normal. Pupils are equal, round, and reactive to light. Right eye exhibits normal extraocular motion. Left eye exhibits normal extraocular motion.  Neck: Trachea normal and full passive range of motion without pain. No edema and no erythema present.  Cardiovascular: Normal rate and regular rhythm.  Exam reveals no gallop and no friction rub.   No murmur heard. Pulmonary/Chest: Effort normal. No respiratory distress. He has no decreased breath sounds. He has no wheezes. He has rhonchi.  Lymphadenopathy:       Head (right side): No submental, no submandibular, no tonsillar, no preauricular and no posterior auricular adenopathy present.       Head (left side): No submental, no submandibular, no tonsillar, no preauricular and no posterior auricular  adenopathy present.    He has no cervical adenopathy.  Neurological: He is alert and oriented to person, place, and time.  Skin: Skin is warm and dry. He is not diaphoretic.  Psychiatric: He has a normal mood and affect. His behavior is normal.     Assessment and Plan: Blake Wallace is a 64 y.o. male who is here today for cough.  Consistent with bronchitis. -azithromycin given -supportive cough, mucinex advised   1. Cough - chlorpheniramine-HYDROcodone  (TUSSIONEX PENNKINETIC ER) 10-8 MG/5ML SUER; Take 5 mLs by mouth at bedtime as needed for cough.  Dispense: 80 mL; Refill: 0  2. Acute bronchitis, unspecified organism - chlorpheniramine-HYDROcodone (TUSSIONEX PENNKINETIC ER) 10-8 MG/5ML SUER; Take 5 mLs by mouth at bedtime as needed for cough.  Dispense: 80 mL; Refill: 0 - azithromycin (ZITHROMAX) 250 MG tablet; Take 2 tabs PO x 1 dose, then 1 tab PO QD x 4 days  Dispense: 6 tablet; Refill: 0   Trena PlattStephanie English, PA-C [facility] 11/15/2015 5:49 PM

## 2016-02-29 ENCOUNTER — Ambulatory Visit (INDEPENDENT_AMBULATORY_CARE_PROVIDER_SITE_OTHER): Payer: Commercial Managed Care - PPO | Admitting: Physician Assistant

## 2016-02-29 VITALS — BP 138/80 | HR 84 | Temp 99.0°F | Resp 16 | Ht 69.0 in | Wt 186.0 lb

## 2016-02-29 DIAGNOSIS — R05 Cough: Secondary | ICD-10-CM

## 2016-02-29 DIAGNOSIS — G44209 Tension-type headache, unspecified, not intractable: Secondary | ICD-10-CM | POA: Diagnosis not present

## 2016-02-29 DIAGNOSIS — R059 Cough, unspecified: Secondary | ICD-10-CM

## 2016-02-29 MED ORDER — PSEUDOEPHEDRINE-GUAIFENESIN ER 60-600 MG PO TB12: 1.0000 | ORAL_TABLET | Freq: Two times a day (BID) | ORAL | Status: DC

## 2016-02-29 MED ORDER — HYDROCODONE-HOMATROPINE 5-1.5 MG/5ML PO SYRP: 5.0000 mL | ORAL_SOLUTION | Freq: Three times a day (TID) | ORAL | Status: DC | PRN

## 2016-02-29 MED ORDER — BENZONATATE 100 MG PO CAPS: 100.0000 mg | ORAL_CAPSULE | Freq: Three times a day (TID) | ORAL | Status: DC | PRN

## 2016-02-29 NOTE — Progress Notes (Signed)
   Subjective:    Patient ID: Blake Wallace, male    DOB: 09/30/1951, 65 y.o.   MRN: [id number]  Chief Complaint  Patient presents with  . Cough    took tamiflu last pm, there is a language barrier    Medications, allergies, past medical history, surgical history, family history, social history and problem list reviewed and updated.  HPI  65 yof presents with cough as above.   Has been seen here several times for cough. Had normal cxr one year ago. Diagnosed with acute bronchitis 3 months ago and treated with azithro. Cough resolved with this for couple months. Approx 3 wks ago cough returned. Present throughout day. Not prod. Denies new meds. Denies head/nasal congestion. Denies fevers, chills. Mild sore throat past couple days.   Denies hx asthma. No inhalers. Denies acid reflux sx or history. Received flu vaccine this year. Denies LE swelling.   Review of Systems See HPI. No abd pain.     Objective:   Physical Exam  Constitutional: He appears well-developed and well-nourished.  Non-toxic appearance. He does not have a sickly appearance. He does not appear ill. No distress.  BP 138/80 mmHg  Pulse 84  Temp(Src) 99 F (37.2 C)  Resp 16  Ht 5\' 9"  (1.753 m)  Wt 186 lb (84.369 kg)  BMI 27.45 kg/m2  SpO2 96%   HENT:  Right Ear: Tympanic membrane normal.  Left Ear: Tympanic membrane normal.  Nose: Mucosal edema and rhinorrhea present. Right sinus exhibits no maxillary sinus tenderness and no frontal sinus tenderness. Left sinus exhibits no maxillary sinus tenderness and no frontal sinus tenderness.  Mouth/Throat: Uvula is midline, oropharynx is clear and moist and mucous membranes are normal.  Cardiovascular: Normal rate, regular rhythm and normal heart sounds.   No LE edema.   Pulmonary/Chest: Effort normal and breath sounds normal. No tachypnea.  Lymphadenopathy:       Head (right side): No submental, no submandibular and no tonsillar adenopathy present.       Head (left side):  No submental, no submandibular and no tonsillar adenopathy present.    He has no cervical adenopathy.      Assessment & Plan:   Cough - Plan: HYDROcodone-homatropine (HYCODAN) 5-1.5 MG/5ML syrup, benzonatate (TESSALON) 100 MG capsule, pseudoephedrine-guaifenesin (MUCINEX D) 60-600 MG 12 hr tablet  Tension-type headache, not intractable, unspecified chronicity pattern --suspect chronic bronchitis or recurrent acute bronchitis as cause of cough, doubt laryngopharyngeal reflux with no symptoms or throat clearing, no hx asthma, no head/nasal congestion/allergic rhinitis sx to suggest post nasal drip, no new medicines, and no signs heart failure --hycodan, tessalon, fluids, mucinex-d --rtc if symptoms worsen or don't improve, may need further imaging if so   Donnajean Lopesodd M. Goro Wenrick, PA-C Physician Assistant-Certified [facility]  02/29/2016 9:44 AM

## 2016-02-29 NOTE — Patient Instructions (Addendum)
Your ongoing cough is likely from chronic bronchitis.  Please take the tessalon during the day and the hycodan at night for cough.  Please take the mucinex-d twice daily for congestion. Drink lots of water with this. If you're not feeling better by mid week or you start having fevers please let us know asap.  If your cough continues to persist for another several weeks please come back to see [place]. You may need more imaging.     Chronic Bronchitis Chronic bronchitis is a lasting inflammation of the bronchial tubes, which are the tubes that carry air into your lungs. This is inflammation that occurs:   On most days of the week.   For at least three months at a time.   Over a period of two years in a row. When the bronchial tubes are inflamed, they start to produce mucus. The inflammation and buildup of mucus make it more difficult to breathe. Chronic bronchitis is usually a permanent problem and is one type of chronic obstructive pulmonary disease (COPD). People with chronic bronchitis are at greater risk for getting repeated colds, or respiratory infections. CAUSES  Chronic bronchitis most often occurs in people who have:  Long-standing, severe asthma.  A history of smoking.  Asthma and who also smoke. SIGNS AND SYMPTOMS  Chronic bronchitis may cause the following:   A cough that brings up mucus (productive cough).  Shortness of breath.  Early morning headache.  Wheezing.  Chest discomfort.   Recurring respiratory infections. DIAGNOSIS  Your health care provider may confirm the diagnosis by:  Taking your medical history.  Performing a physical exam.  Taking a chest X-ray.   Performing pulmonary function tests. TREATMENT  Treatment involves controlling symptoms with medicines, oxygen therapy, or making lifestyle changes, such as exercising and eating a healthy, well-balanced diet. Medicines could include:  Inhalers to improve air flow in and out of your  lungs.  Antibiotics to treat bacterial infections, such as pneumonia, sinus infections, and acute bronchitis. As a preventative measure, your health care provider may recommend routine vaccinations for influenza and pneumonia. This is to prevent infection and hospitalization since you may be more at risk for these types of infections.  HOME CARE INSTRUCTIONS  Take medicines only as directed by your health care provider.   If you smoke cigarettes, chew tobacco, or use electronic cigarettes, quit. If you need help quitting, ask your health care provider.  Avoid pollen, dust, animal dander, molds, smoke, and other things that cause shortness of breath or wheezing attacks.  Talk to your health care provider about possible exercise routines. Regular exercise is very important to help you feel better.  If you are prescribed oxygen use at home follow these guidelines:  Never smoke while using oxygen. Oxygen does not burn or explode, but flammable materials will burn faster in the presence of oxygen.  Keep a Government social research officer close by. Let your fire department know that you have oxygen in your home.  Warn visitors not to smoke near you when you are using oxygen. Put up "no smoking" signs in your home where you most often use the oxygen.  Regularly test your smoke detectors at home to make sure they work. If you receive care in your home from a nurse or other health care provider, he or she may also check to make sure your smoke detectors work.  Ask your health care provider whether you would benefit from a pulmonary rehabilitation program.  Do not wait to get medical  care if you have any concerning symptoms. Delays could cause permanent injury and may be life threatening. SEEK MEDICAL CARE IF:  You have increased coughing or shortness of breath or both.  You have muscle aches.  You have chest pain.  Your mucus gets thicker.  Your mucus changes from clear or white to yellow, green,  gray, or bloody. SEEK IMMEDIATE MEDICAL CARE IF:  Your usual medicines do not stop your wheezing.   You have increased difficulty breathing.   You have any problems with the medicine you are taking, such as a rash, itching, swelling, or trouble breathing. MAKE SURE YOU:   Understand these instructions.  Will watch your condition.  Will get help right away if you are not doing well or get worse.   This information is not intended to replace advice given to you by your health care provider. Make sure you discuss any questions you have with your health care provider.   Document Released: 09/09/2006 Document Revised: 12/13/2014 Document Reviewed: 12/31/2013 Elsevier Interactive Patient Education [employer] Else[employer]

## 2016-03-01 ENCOUNTER — Encounter: Payer: Self-pay | Admitting: Physician Assistant

## 2016-03-03 ENCOUNTER — Ambulatory Visit (INDEPENDENT_AMBULATORY_CARE_PROVIDER_SITE_OTHER): Payer: Commercial Managed Care - PPO | Admitting: Urgent Care

## 2016-03-03 ENCOUNTER — Ambulatory Visit (INDEPENDENT_AMBULATORY_CARE_PROVIDER_SITE_OTHER): Payer: Commercial Managed Care - PPO

## 2016-03-03 VITALS — BP 110/70 | HR 94 | Temp 98.4°F | Resp 16 | Ht 69.0 in | Wt 176.8 lb

## 2016-03-03 DIAGNOSIS — R0789 Other chest pain: Secondary | ICD-10-CM

## 2016-03-03 DIAGNOSIS — R059 Cough, unspecified: Secondary | ICD-10-CM

## 2016-03-03 DIAGNOSIS — R05 Cough: Secondary | ICD-10-CM

## 2016-03-03 DIAGNOSIS — J189 Pneumonia, unspecified organism: Secondary | ICD-10-CM

## 2016-03-03 MED ORDER — AZITHROMYCIN 250 MG PO TABS: ORAL_TABLET | ORAL | Status: DC

## 2016-03-03 MED ORDER — HYDROCODONE-HOMATROPINE 5-1.5 MG/5ML PO SYRP: 5.0000 mL | ORAL_SOLUTION | Freq: Every evening | ORAL | Status: DC | PRN

## 2016-03-03 MED ORDER — ALBUTEROL SULFATE HFA 108 (90 BASE) MCG/ACT IN AERS: 2.0000 | INHALATION_SPRAY | Freq: Four times a day (QID) | RESPIRATORY_TRACT | Status: DC | PRN

## 2016-03-03 NOTE — Patient Instructions (Addendum)
Community-Acquired Pneumonia, Adult Pneumonia is an infection of the lungs. There are different types of pneumonia. One type can develop while a person is in a hospital. A different type, called community-acquired pneumonia, develops in people who are not, or have not recently been, in the hospital or other health care facility.  CAUSES Pneumonia may be caused by bacteria, viruses, or funguses. Community-acquired pneumonia is often caused by Streptococcus pneumonia bacteria. These bacteria are often passed from one person to another by breathing in droplets from the cough or sneeze of an infected person. RISK FACTORS The condition is more likely to develop in:  People who havechronic diseases, such as chronic obstructive pulmonary disease (COPD), asthma, congestive heart failure, cystic fibrosis, diabetes, or kidney disease.  People who haveearly-stage or late-stage HIV.  People who havesickle cell disease.  People who havehad their spleen removed (splenectomy).  People who havepoor dental hygiene.  People who havemedical conditions that increase the risk of breathing in (aspirating) secretions their own mouth and nose.   People who havea weakened immune system (immunocompromised).  People who smoke.  People whotravel to areas where pneumonia-causing germs commonly exist.  People whoare around animal habitats or animals that have pneumonia-causing germs, including birds, bats, rabbits, cats, and farm animals. SYMPTOMS Symptoms of this condition include:  Adry cough.  A wet (productive) cough.  Fever.  Sweating.  Chest pain, especially when breathing deeply or coughing.  Rapid breathing or difficulty breathing.  Shortness of breath.  Shaking chills.  Fatigue.  Muscle aches. DIAGNOSIS Your health care provider will take a medical history and perform a physical exam. You may also have other tests, including:  Imaging studies of your chest, including  X-rays.  Tests to check your blood oxygen level and other blood gases.  Other tests on blood, mucus (sputum), fluid around your lungs (pleural fluid), and urine. If your pneumonia is severe, other tests may be done to identify the specific cause of your illness. TREATMENT The type of treatment that you receive depends on many factors, such as the cause of your pneumonia, the medicines you take, and other medical conditions that you have. For most adults, treatment and recovery from pneumonia may occur at home. In some cases, treatment must happen in a hospital. Treatment may include:  Antibiotic medicines, if the pneumonia was caused by bacteria.  Antiviral medicines, if the pneumonia was caused by a virus.  Medicines that are given by mouth or through an IV tube.  Oxygen.  Respiratory therapy. Although rare, treating severe pneumonia may include:  Mechanical ventilation. This is done if you are not breathing well on your own and you cannot maintain a safe blood oxygen level.  Thoracentesis. This procedureremoves fluid around one lung or both lungs to help you breathe better. HOME CARE INSTRUCTIONS  Take over-the-counter and prescription medicines only as told by your health care provider.  Only takecough medicine if you are losing sleep. Understand that cough medicine can prevent your body's natural ability to remove mucus from your lungs.  If you were prescribed an antibiotic medicine, take it as told by your health care provider. Do not stop taking the antibiotic even if you start to feel better.  Sleep in a semi-upright position at night. Try sleeping in a reclining chair, or place a few pillows under your head.  Do not use tobacco products, including cigarettes, chewing tobacco, and e-cigarettes. If you need help quitting, ask your health care provider.  Drink enough water to keep your urine   clear or pale yellow. This will help to thin out mucus secretions in your  lungs. PREVENTION There are ways that you can decrease your risk of developing community-acquired pneumonia. Consider getting a pneumococcal vaccine if:  You are older than 63 years of age.  You are older than 63 years of age and are undergoing cancer treatment, have chronic lung disease, or have other medical conditions that affect your immune system. Ask your health care provider if this applies to you. There are different types and schedules of pneumococcal vaccines. Ask your health care provider which vaccination option is best for you. You may also prevent community-acquired pneumonia if you take these actions:  Get an influenza vaccine every year. Ask your health care provider which type of influenza vaccine is best for you.  Go to the dentist on a regular basis.  Wash your hands often. Use hand sanitizer if soap and water are not available. SEEK MEDICAL CARE IF:  You have a fever.  You are losing sleep because you cannot control your cough with cough medicine. SEEK IMMEDIATE MEDICAL CARE IF:  You have worsening shortness of breath.  You have increased chest pain.  Your sickness becomes worse, especially if you are an older adult or have a weakened immune system.  You cough up blood.   This information is not intended to replace advice given to you by your health care provider. Make sure you discuss any questions you have with your health care provider.   Document Released: 11/22/2005 Document Revised: 08/13/2015 Document Reviewed: 03/19/2015 Elsevier Interactive Patient Education 2016 Elsevier Inc.     IF you received an x-ray today, you will receive an invoice from [facility]. Please contact [facility] at [phone] with questions or concerns regarding your invoice.   IF you received labwork today, you will receive an invoice from [facility]. Please contact [facility] at [phone] with questions or concerns regarding  your invoice.   Our billing staff will not be able to assist you with questions regarding bills from these companies.  You will be contacted with the lab results as soon as they are available. The fastest way to get your results is to activate your My Chart account. Instructions are located on the last page of this paperwork. If you have not heard from us regarding the results in 2 weeks, please contact this office.      

## 2016-03-03 NOTE — Progress Notes (Signed)
    MRN: [id number] DOB: 01/20/1951  Subjective:   Blake Wallace is a 65 y.o. male presenting for chief complaint of Follow-up and Weight Loss  Patient was seen on 02/29/2016 for cough. He was managed for symptoms, advised to rtc if no improvement. Today, he presents reporting worsening productive cough, now causing chest pain and shob. Also has subjective fever, decreased appetite, fatigue. He has not been hydrating. Denies wheezing, n/v, sore throat, abdominal pain. Denies smoking, alcohol use. Denies history of asthma. Of note, patient was treated for bronchitis with azithromycin 11/2015. His cough resolved after ~2 months.   Blake Wallace has a current medication list which includes the following prescription(s): acetaminophen, benzonatate, pseudoephedrine-guaifenesin, chlorpheniramine-hydrocodone, and hydrocodone-homatropine. Also has No Known Allergies.  Blake Wallace  has no past medical history on file. Also  has past surgical history that includes Brain surgery.  Objective:   Vitals: BP 110/70 mmHg  Pulse 94  Temp(Src) 98.4 F (36.9 C) (Oral)  Resp 16  Ht 5\' 9"  (1.753 m)  Wt 176 lb 12.8 oz (80.196 kg)  BMI 26.10 kg/m2  SpO2 97%  Physical Exam  Constitutional: He is oriented to person, place, and time. He appears well-developed and well-nourished.  HENT:  Mouth/Throat: Oropharynx is clear and moist.  Eyes: No scleral icterus.  Cardiovascular: Normal rate, regular rhythm and intact distal pulses.  Exam reveals no gallop and no friction rub.   No murmur heard. Pulmonary/Chest: No respiratory distress. He has no wheezes. He has no rales.  Neurological: He is alert and oriented to person, place, and time.  Skin: Skin is warm and dry.   Dg Chest 2 View  03/03/2016  CLINICAL DATA:  Shortness of breath, atypical chest pain and cough EXAM: CHEST  2 VIEW COMPARISON:  PA and lateral chest x-ray of February 06, 2015 FINDINGS: The lungs are well-expanded and clear. The heart and pulmonary vascularity  are normal. The mediastinum is normal in width. There is tortuosity of the descending thoracic aorta. There is no pleural effusion. The bony thorax exhibits no acute abnormality. IMPRESSION: There is no active cardiopulmonary disease. Electronically Signed   By: David  Swazil[place] M.D.   On: 03/03/2016 09:19   Assessment and Plan :   1. CAP (community acquired pneumonia) 2. Cough 3. Atypical chest pain - Will cover patient for CAP due to progression of symptoms. Start azithromycin, Hycodan and Tessalon for cough. Recommended increased hydration. RTC in 2 days if no improvement.  Wallis BambergMario Demetruis Depaul, PA-C [facility] [phone] 03/03/2016 9:11 AM

## 2016-12-18 ENCOUNTER — Ambulatory Visit (INDEPENDENT_AMBULATORY_CARE_PROVIDER_SITE_OTHER): Payer: Commercial Managed Care - PPO | Admitting: Physician Assistant

## 2016-12-18 VITALS — BP 120/82 | HR 71 | Temp 98.0°F | Resp 17 | Ht 69.5 in | Wt 187.5 lb

## 2016-12-18 DIAGNOSIS — R05 Cough: Secondary | ICD-10-CM | POA: Diagnosis not present

## 2016-12-18 DIAGNOSIS — R059 Cough, unspecified: Secondary | ICD-10-CM

## 2016-12-18 DIAGNOSIS — R0981 Nasal congestion: Secondary | ICD-10-CM | POA: Diagnosis not present

## 2016-12-18 DIAGNOSIS — J208 Acute bronchitis due to other specified organisms: Secondary | ICD-10-CM | POA: Diagnosis not present

## 2016-12-18 MED ORDER — AZITHROMYCIN 250 MG PO TABS: ORAL_TABLET | ORAL | 0 refills | Status: DC

## 2016-12-18 MED ORDER — FLUTICASONE PROPIONATE 50 MCG/ACT NA SUSP: 2.0000 | Freq: Every day | NASAL | 6 refills | Status: DC

## 2016-12-18 MED ORDER — BENZONATATE 100 MG PO CAPS: 100.0000 mg | ORAL_CAPSULE | Freq: Three times a day (TID) | ORAL | 0 refills | Status: DC | PRN

## 2016-12-18 MED ORDER — HYDROCODONE-HOMATROPINE 5-1.5 MG/5ML PO SYRP: 5.0000 mL | ORAL_SOLUTION | Freq: Three times a day (TID) | ORAL | 0 refills | Status: DC | PRN

## 2016-12-18 NOTE — Progress Notes (Signed)
Blake Wallace  MRN: [id number] DOB: 29-Apr-1951  PCP: No PCP Per Patient  Subjective:  Pt is a 66 year old male presents to clinic for cough x 2 months.  Endorses some wheezing, difficulty sleeping at night, sore throat, dry throat.Blake Wallace  He wakes up a few times a night with cough. Productive cough.  Denies nasal congestion, chest pain, fever, chills, SOB, headache, sinus pain/pressure.  He has tried cough medication, not helping.   Review of Systems  Constitutional: Negative for chills, diaphoresis and fever.  HENT: Positive for congestion, postnasal drip, rhinorrhea and sore throat. Negative for sinus pain, sinus pressure and voice change.   Respiratory: Negative for cough, chest tightness, shortness of breath and wheezing.   Cardiovascular: Negative for chest pain, palpitations and leg swelling.  Gastrointestinal: Negative for diarrhea, nausea and vomiting.  Musculoskeletal: Negative for neck pain.  Neurological: Negative for dizziness, syncope, light-headedness and headaches.  Psychiatric/Behavioral: Positive for sleep disturbance. The patient is not nervous/anxious.     There are no active problems to display for this patient.   Current Outpatient Prescriptions on File Prior to Visit  Medication Sig Dispense Refill  . acetaminophen (TYLENOL) 650 MG CR tablet Take 650 mg by mouth every 8 (eight) hours as needed for pain.    Blake Wallace albuterol (PROVENTIL HFA;VENTOLIN HFA) 108 (90 Base) MCG/ACT inhaler Inhale 2 puffs into the lungs every 6 (six) hours as needed for wheezing or shortness of breath (cough, shortness of breath or wheezing.). 1 Inhaler 1  . pseudoephedrine-guaifenesin (MUCINEX D) 60-600 MG 12 hr tablet Take 1 tablet by mouth every 12 (twelve) hours. 60 tablet 1  . azithromycin (ZITHROMAX) 250 MG tablet Start with 2 tablets today, then 1 daily thereafter. (Patient not taking: Reported on 12/18/2016) 6 tablet 0  . benzonatate (TESSALON) 100 MG capsule Take 1-2 capsules (100-200 mg  total) by mouth 3 (three) times daily as needed for cough. (Patient not taking: Reported on 12/18/2016) 40 capsule 0  . HYDROcodone-homatropine (HYCODAN) 5-1.5 MG/5ML syrup Take 5 mLs by mouth at bedtime as needed. (Patient not taking: Reported on 12/18/2016) 120 mL 0   No current facility-administered medications on file prior to visit.     No Known Allergies   Objective:  BP 120/82   Pulse 71   Temp 98 F (36.7 C) (Oral)   Resp 17   Ht 5' 9.5" (1.765 m)   Wt 187 lb 8 oz (85 kg)   SpO2 99%   BMI 27.29 kg/m   Physical Exam  Constitutional: He is oriented to person, place, and time and well-developed, well-nourished, and in no distress. No distress.  HENT:  Right Ear: Tympanic membrane normal.  Left Ear: Tympanic membrane normal.  Nose: Mucosal edema and rhinorrhea present. Right sinus exhibits no maxillary sinus tenderness and no frontal sinus tenderness. Left sinus exhibits no maxillary sinus tenderness and no frontal sinus tenderness.  Cardiovascular: Normal rate, regular rhythm and normal heart sounds.   Pulmonary/Chest: Effort normal. He has no decreased breath sounds. He has no wheezes. He has rhonchi in the left lower field.  Neurological: He is alert and oriented to person, place, and time. GCS score is 15.  Skin: Skin is warm and dry.  Psychiatric: Mood, memory, affect and judgment normal.  Vitals reviewed.   Assessment and Plan :  1. Acute bronchitis due to other specified organisms 2. Cough 3. Nasal congestion - benzonatate (TESSALON) 100 MG capsule; Take 1-2 capsules (100-200 mg total) by mouth 3 (three) times  daily as needed for cough.  Dispense: 40 capsule; Refill: 0 - azithromycin (ZITHROMAX) 250 MG tablet; Take 2 tabs PO x 1 dose, then 1 tab PO QD x 4 days  Dispense: 6 tablet; Refill: 0 - HYDROcodone-homatropine (HYCODAN) 5-1.5 MG/5ML syrup; Take 5 mLs by mouth every 8 (eight) hours as needed for cough.  Dispense: 120 mL; Refill: 0 - fluticasone (FLONASE) 50  MCG/ACT nasal spray; Place 2 sprays into both nostrils daily.  Dispense: 16 g; Refill: 6 - Supportive care: Push fluids, warm tea with honey, rest. RTC in 5-7 days if no improvement.   Blake CollieWhitney Deaisha Welborn, PA-C  [facility] 12/18/2016 9:11 AM

## 2016-12-18 NOTE — Progress Notes (Signed)
     IF you received an x-ray today, you will receive an invoice from [facility]. Please contact  Radiology at [phone] with questions or concerns regarding your invoice.   IF you received labwork today, you will receive an invoice from [facility]. Please contact [facility] at [phone] with questions or concerns regarding your invoice.   Our billing staff will not be able to assist you with questions regarding bills from these companies.  You will be contacted with the lab results as soon as they are available. The fastest way to get your results is to activate your My Chart account. Instructions are located on the last page of this paperwork. If you have not heard from us regarding the results in 2 weeks, please contact this office.     

## 2016-12-18 NOTE — Patient Instructions (Addendum)
Place a humidifier in your room at night. This will help your sore throat. Stay well hydrated, drink at least 2 liters of water a day. Warm tea with honey will help your throat.   Flonase: 2 sprays each nostril at night before your go to bed and in the morning when you wake up.  Tessalon: For daytime cough Hycodan: Take this at night before bed.  Azithromycin: Please take the entire course of this medication.

## 2018-01-14 ENCOUNTER — Other Ambulatory Visit: Payer: Self-pay

## 2018-01-14 ENCOUNTER — Encounter: Payer: Self-pay | Admitting: Family Medicine

## 2018-01-14 ENCOUNTER — Ambulatory Visit: Payer: Commercial Managed Care - PPO | Admitting: Family Medicine

## 2018-01-14 VITALS — BP 147/72 | HR 84 | Temp 98.3°F | Resp 17 | Ht 69.5 in | Wt 185.6 lb

## 2018-01-14 DIAGNOSIS — R05 Cough: Secondary | ICD-10-CM

## 2018-01-14 DIAGNOSIS — R059 Cough, unspecified: Secondary | ICD-10-CM

## 2018-01-14 DIAGNOSIS — J069 Acute upper respiratory infection, unspecified: Secondary | ICD-10-CM | POA: Diagnosis not present

## 2018-01-14 DIAGNOSIS — Z1211 Encounter for screening for malignant neoplasm of colon: Secondary | ICD-10-CM

## 2018-01-14 MED ORDER — IPRATROPIUM BROMIDE 0.03 % NA SOLN: 2.0000 | Freq: Four times a day (QID) | NASAL | 1 refills | Status: AC

## 2018-01-14 MED ORDER — MUCINEX DM MAXIMUM STRENGTH 60-1200 MG PO TB12: 1.0000 | ORAL_TABLET | Freq: Two times a day (BID) | ORAL | 1 refills | Status: DC

## 2018-01-14 MED ORDER — HYDROCODONE-HOMATROPINE 5-1.5 MG/5ML PO SYRP: 5.0000 mL | ORAL_SOLUTION | Freq: Three times a day (TID) | ORAL | 0 refills | Status: DC | PRN

## 2018-01-14 NOTE — Progress Notes (Signed)
Subjective:  By signing my name below, I, Essence Howell, attest that this documentation has been prepared under the direction and in the presence of Norberto SorensonEva Carleena Mires, MD Electronically Signed: Charline BillsEssence Howell, ED Scribe 01/14/2018 at 2:38 PM.   Patient ID: Blake Wallace, male    DOB: 02/18/1951, 67 y.o.   MRN: [id number]  Chief Complaint  Patient presents with  . Cough    x 2 days with sneezing and rn. Cough is nonproductive, no fevers, chills or sweats per pt.  Pt taking tylenol for sxs, last taken this morning ( 2 tabs @ 500 mg each).   HPI Blake BihariKunthea Kinderman is a 67 y.o. male who [facility] complaining of minimally productive cough and sneezing x 2 days. Pt denies fever, chills, diaphoresis, appetite change, abdominal pain. He has tried 1000 mg Tylenol with the last dose being this morning. Pt received his flu vaccine this season.  Pt has been taking digestive lax. He has not had a colonoscopy but agrees to have one scheduled.  Pt's primary language is [place].  No past medical history on file. Current Outpatient Medications on File Prior to Visit  Medication Sig Dispense Refill  . acetaminophen (TYLENOL) 650 MG CR tablet Take 650 mg by mouth every 8 (eight) hours as needed for pain.    [name]. albuterol (PROVENTIL HFA;VENTOLIN HFA) 108 (90 Base) MCG/ACT inhaler Inhale 2 puffs into the lungs every 6 (six) hours as needed for wheezing or shortness of breath (cough, shortness of breath or wheezing.). (Patient not taking: Reported on 01/14/2018) 1 Inhaler 1  . fluticasone (FLONASE) 50 MCG/ACT nasal spray Place 2 sprays into both nostrils daily. (Patient not taking: Reported on 01/14/2018) 16 g 6  . pseudoephedrine-guaifenesin (MUCINEX D) 60-600 MG 12 hr tablet Take 1 tablet by mouth every 12 (twelve) hours. (Patient not taking: Reported on 01/14/2018) 60 tablet 1   No current facility-administered medications on file prior to visit.    No Known Allergies   Past Surgical History:    Procedure Laterality Date  . BRAIN SURGERY     No family history on file. Social History   Socioeconomic History  . Marital status: Married    Spouse name: None  . Number of children: None  . Years of education: None  . Highest education level: None  Social Needs  . Financial resource strain: None  . Food insecurity - worry: None  . Food insecurity - inability: None  . Transportation needs - medical: None  . Transportation needs - non-medical: None  Occupational History  . None  Tobacco Use  . Smoking status: Never Smoker  . Smokeless tobacco: Never Used  Substance and Sexual Activity  . Alcohol use: No    Alcohol/week: 0.0 oz  . Drug use: No  . Sexual activity: None  Other Topics Concern  . None  Social History Narrative   From [place].      Marital status:  Divorced.      Children:  4 children; 2 grandchildren.      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     [facility] 01/14/2018 12/18/2016 03/03/2016 11/15/2015  Decreased Interest 0 0 0 0  Down, Depressed, Hopeless 0 0 0 0  PHQ - 2 Score 0 0 0 0    Review of Systems  Constitutional: Negative for appetite change, chills, diaphoresis and fever.  HENT: Positive for rhinorrhea and sneezing.  Respiratory: Positive for cough.   Gastrointestinal: Negative for abdominal pain.      Objective:   Physical Exam  Constitutional: He is oriented to person, place, and time. He appears well-developed and well-nourished. No distress.  HENT:  Head: Normocephalic and atraumatic.  Right Ear: Tympanic membrane is erythematous.  Left Ear: Tympanic membrane normal.  Nose: Nose normal.  Mouth/Throat: Posterior oropharyngeal erythema present.  Eyes: Conjunctivae and EOM are normal.  Neck: Neck supple. No tracheal deviation present.  Cardiovascular: Normal rate and regular rhythm.  Pulmonary/Chest: Effort normal and breath sounds normal. No respiratory distress.   Musculoskeletal: Normal range of motion.  Neurological: He is alert and oriented to person, place, and time.  Skin: Skin is warm and dry.  Psychiatric: He has a normal mood and affect. His behavior is normal.  Nursing note and vitals reviewed.  BP (!) 147/72 (BP Location: Left Arm, Patient Position: Sitting, Cuff Size: Normal)   Pulse 84   Temp 98.3 F (36.8 C) (Oral)   Resp 17   Ht 5' 9.5" (1.765 m)   Wt 185 lb 9.6 oz (84.2 kg)   SpO2 93%   BMI 27.02 kg/m     Assessment & Plan:   1. Acute upper respiratory infection - suspect viral, symptomatic care  2. Cough   3. Special screening for malignant neoplasms, colon     Orders Placed This Encounter  Procedures  . Ambulatory referral to Gastroenterology    Referral Priority:   Routine    Referral Type:   Consultation    Referral Reason:   Specialty Services Required    Number of Visits Requested:   1    Meds ordered this encounter  Medications  . Dextromethorphan-Guaifenesin (MUCINEX DM MAXIMUM STRENGTH) 60-1200 MG TB12    Sig: Take 1 tablet by mouth every 12 (twelve) hours.    Dispense:  14 each    Refill:  1  . ipratropium (ATROVENT) 0.03 % nasal spray    Sig: Place 2 sprays into the nose 4 (four) times daily.    Dispense:  30 mL    Refill:  1  . HYDROcodone-homatropine (HYCODAN) 5-1.5 MG/5ML syrup    Sig: Take 5 mLs by mouth every 8 (eight) hours as needed for cough.    Dispense:  120 mL    Refill:  0    I personally performed the services described in this documentation, which was scribed in my presence. The recorded information has been reviewed and considered, and addended by me as needed.   Norberto Sorenson, M.D.  Primary Care at [facility] [address] [place], [place] [address] [phone] phone [phone] fax  01/16/18 12:41 AM

## 2018-01-14 NOTE — Patient Instructions (Addendum)
Please make an appointment for your full physical ASAP.  You can schedule this today at our front desk.  You should stay away from all cough and cold medications containing decongestant, especially phenylephrine and pseudoephedrine (will be listed under the active ingredient list).  To make it easier, CoricidinHBP or Mucinex DM are both products that would be safe to use. Do not use anything that is named with a "-D" such as zyrtec-D, claritin-D, or Mucinex-D though "-DM" IS ok to use.   IF you received an x-ray today, you will receive an invoice from [facility]. Please contact [facility] at [phone] with questions or concerns regarding your invoice.   IF you received labwork today, you will receive an invoice from [facility]. Please contact [facility] at [phone] with questions or concerns regarding your invoice.   Our billing staff will not be able to assist you with questions regarding bills from these companies.  You will be contacted with the lab results as soon as they are available. The fastest way to get your results is to activate your My Chart account. Instructions are located on the last page of this paperwork. If you have not heard from us regarding the results in 2 weeks, please contact this office.     Upper Respiratory Infection, Adult Most upper respiratory infections (URIs) are caused by a virus. A URI affects the nose, throat, and upper air passages. The most common type of URI is often called "the common cold." Follow these instructions at home:  Take medicines only as told by your doctor.  Gargle warm saltwater or take cough drops to comfort your throat as told by your doctor.  Use a warm mist humidifier or inhale steam from a shower to increase air moisture. This may make it easier to breathe.  Drink enough fluid to keep your pee (urine) clear or pale yellow.  Eat soups and other clear broths.  Have a healthy diet.  Rest as needed.  Go  back to work when your fever is gone or your doctor says it is okay. ? You may need to stay home longer to avoid giving your URI to others. ? You can also wear a face mask and wash your hands often to prevent spread of the virus.  Use your inhaler more if you have asthma.  Do not use any tobacco products, including cigarettes, chewing tobacco, or electronic cigarettes. If you need help quitting, ask your doctor. Contact a doctor if:  You are getting worse, not better.  Your symptoms are not helped by medicine.  You have chills.  You are getting more short of breath.  You have brown or red mucus.  You have yellow or brown discharge from your nose.  You have pain in your face, especially when you bend forward.  You have a fever.  You have puffy (swollen) neck glands.  You have pain while swallowing.  You have white areas in the back of your throat. Get help right away if:  You have very bad or constant: ? Headache. ? Ear pain. ? Pain in your forehead, behind your eyes, and over your cheekbones (sinus pain). ? Chest pain.  You have long-lasting (chronic) lung disease and any of the following: ? Wheezing. ? Long-lasting cough. ? Coughing up blood. ? A change in your usual mucus.  You have a stiff neck.  You have changes in your: ? Vision. ? Hearing. ? Thinking. ? Mood. This information is not intended to replace advice given  to you by your health care provider. Make sure you discuss any questions you have with your health care provider. Document Released: 05/10/2008 Document Revised: 07/25/2016 Document Reviewed: 02/27/2014 Elsevier Interactive Patient Education  2018 [employer].

## 2018-02-13 ENCOUNTER — Encounter: Payer: Self-pay | Admitting: Family Medicine

## 2018-12-28 ENCOUNTER — Ambulatory Visit: Payer: Self-pay

## 2018-12-28 NOTE — Telephone Encounter (Signed)
Incoming call from Patient with a complaint of an area on patient right leg.  Patient states onset was about  A month ago.  Patient rates the pain 5 on a 1 to 10 scale of pain.   Denies a fever. Area is pink in color. Patient has a friend talking for him .  Patient is at work.  Scheduled appointment for 12/29/18  @ 1:25pm  Appointment with Sunnie Nielsen  .Patient voices understanding.  Reviewed care advice.  Voiced understanding.                                                                                    Reason for Disposition . [1] MODERATE leg swelling (e.g., swelling extends up to knees) AND [2] new onset or worsening  Answer Assessment - Initial Assessment Questions 1. ONSET: "When did the swelling start?" (e.g., minutes, hours, days)     Over a month2. LOCATION: "What part of the leg is swollen?"  "Are both legs swollen or just one leg?"    right leg 3. SEVERITY: "How bad is the swelling?" (e.g., localized; mild, moderate, severe)  - Localized - small area of swelling localized to one leg  - MILD pedal edema - swelling limited to foot and ankle, pitting edema < 1/4 inch (6 mm) deep, rest and elevation eliminate most or all swelling  - MODERATE edema - swelling of lower leg to knee, pitting edema > 1/4 inch (6 mm) deep, rest and elevation only partially reduce swelling  - SEVERE edema - swelling extends above knee, facial or hand swelling present      mild4. REDNESS: "Does the swelling look red or infected?"     reddnesss 5. PAIN: "Is the swelling painful to touch?" If so, ask: "How painful is it?"   (Scale 1-10; mild, moderate or severe)     5 6. FEVER: "Do you have a fever?" If so, ask: "What is it, how was it measured, and when did it start?"      denies 7. CAUSE: "What do you think is causing the leg swelling?"     no 8. MEDICAL HISTORY: "Do you have a history of heart failure, kidney disease, liver failure, or cancer?"     *No Answer* 9. RECURRENT SYMPTOM: "Have you had leg  swelling before?" If so, ask: "When was the last time?" "What happened that time?"     yes10. OTHER SYMPTOMS: "Do you have any other symptoms?" (e.g., chest pain, difficulty breathing)       Yes  11. PREGNANCY: "Is there any chance you are pregnant?" "When was your last menstrual period?"       na  Protocols used: LEG SWELLING AND EDEMA-A-AH

## 2018-12-29 ENCOUNTER — Ambulatory Visit: Payer: Commercial Managed Care - PPO | Admitting: Osteopathic Medicine

## 2018-12-29 ENCOUNTER — Other Ambulatory Visit: Payer: Self-pay

## 2018-12-29 ENCOUNTER — Encounter: Payer: Self-pay | Admitting: Osteopathic Medicine

## 2018-12-29 VITALS — BP 130/80 | HR 70 | Temp 98.3°F | Resp 17 | Ht 69.5 in | Wt 194.2 lb

## 2018-12-29 DIAGNOSIS — Z1211 Encounter for screening for malignant neoplasm of colon: Secondary | ICD-10-CM

## 2018-12-29 DIAGNOSIS — Z299 Encounter for prophylactic measures, unspecified: Secondary | ICD-10-CM | POA: Diagnosis not present

## 2018-12-29 DIAGNOSIS — I872 Venous insufficiency (chronic) (peripheral): Secondary | ICD-10-CM

## 2018-12-29 DIAGNOSIS — Z23 Encounter for immunization: Secondary | ICD-10-CM

## 2018-12-29 MED ORDER — TRIAMCINOLONE ACETONIDE 0.1 % EX CREA: 1.0000 "application " | TOPICAL_CREAM | Freq: Two times a day (BID) | CUTANEOUS | 1 refills | Status: AC

## 2018-12-29 NOTE — Progress Notes (Signed)
HPI: Blake Wallace is a 68 y.o. male who  has no past medical history on file.  he presents to [facility] today, 12/29/18,  for chief complaint of:  Chief Complaint  Patient presents with  . rash on right leg x 2 weeks, red and itchy per pt    Per pt he is not using any otc med for the rash.    Rash . Location: Right lower extremity . Quality: Red, itching . Duration: 2 weeks . Timing: constant  BP  Patient denies history of high blood pressure, so his BP has been borderline/high.  He states he does not have a regular doctor, just comes here for care as needed. Recheck BP ok  BP Readings from Last 3 Encounters:  12/29/18 130/80  01/14/18 (!) 147/72  12/18/16 120/82       Past medical history, surgical history, and family history reviewed.  Current medication list and allergy/intolerance information reviewed.   (See remainder of HPI, ROS, Phys Exam below)       ASSESSMENT/PLAN:   Venous stasis dermatitis of right lower extremity - appears c/w dermatitis of some kind, less likely contact, seems possible PVD related, if no better would consider ABI or vascular referral.   Colon cancer screening - Plan: Ambulatory referral to Gastroenterology  Need for prophylactic vaccination and inoculation against influenza  Need for prophylactic measure   Meds ordered this encounter  Medications  . triamcinolone cream (KENALOG) 0.1 %    Sig: Apply 1 application topically 2 (two) times daily. To affected area(s) as needed, up to 2 weeks.    Dispense:  45 g    Refill:  1    Patient Instructions   Will try cream for rash. If rash is not getting better in 2 weeks, come see [place]!  I'm concerned about possible circulation problem, or this may be a skin irritation from something else.     If you have lab work done today you will be contacted with your lab results within the next 2 weeks.  If you have not heard from [place] then please contact us. The fastest  way to get your results is to register for My Chart.   IF you received an x-ray today, you will receive an invoice from [facility]. Please contact [facility] at [phone] with questions or concerns regarding your invoice.   IF you received labwork today, you will receive an invoice from [place]. Please contact [facility] at [phone] with questions or concerns regarding your invoice.   Our billing staff will not be able to assist you with questions regarding bills from these companies.  You will be contacted with the lab results as soon as they are available. The fastest way to get your results is to activate your My Chart account. Instructions are located on the last page of this paperwork. If you have not heard from [place] regarding the results in 2 weeks, please contact this office.       Follow-up plan: Return for recheck skin if needed in 2 weeks. Recommend annual physical with PCP (needs to establish) .                                 ############################################ ############################################ ############################################ ############################################    Outpatient Encounter Medications as of 12/29/2018  Medication Sig  . acetaminophen (TYLENOL) 650 MG CR tablet Take 650 mg by mouth every 8 (eight)  hours as needed for pain.  [name] Dextromethorphan-Guaifenesin (MUCINEX DM MAXIMUM STRENGTH) 60-1200 MG TB12 Take 1 tablet by mouth every 12 (twelve) hours. (Patient not taking: Reported on 12/29/2018)  . HYDROcodone-homatropine (HYCODAN) 5-1.5 MG/5ML syrup Take 5 mLs by mouth every 8 (eight) hours as needed for cough. (Patient not taking: Reported on 12/29/2018)  . ipratropium (ATROVENT) 0.03 % nasal spray Place 2 sprays into the nose 4 (four) times daily. (Patient not taking: Reported on 12/29/2018)  . triamcinolone cream (KENALOG) 0.1 % Apply 1 application topically 2 (two) times  daily. To affected area(s) as needed, up to 2 weeks.   No facility-administered encounter medications on file as of 12/29/2018.    No Known Allergies    Review of Systems:  Constitutional: No recent illness  Cardiac: No  chest pain  Respiratory:  No  shortness of breath.   Musculoskeletal: No new myalgia/arthralgia  Skin: +Rash  Hem/Onc: No  easy bruising/bleeding  Neurologic: No  weakness  Exam:  BP 130/80 (BP Location: Left Arm, Patient Position: Sitting, Cuff Size: Normal)   Pulse 70   Temp 98.3 F (36.8 C) (Oral)   Resp 17   Ht 5' 9.5" (1.765 m)   Wt 194 lb 3.2 oz (88.1 kg)   SpO2 96%   BMI 28.27 kg/m   Constitutional: VS see above. General Appearance: alert, well-developed, well-nourished, NAD  Eyes: Normal lids and conjunctive, non-icteric sclera  Ears, Nose, Mouth, Throat: MMM, Normal external inspection ears/nares/mouth/lips/gums.  Neck: No masses, trachea midline.   Respiratory: Normal respiratory effort. no wheeze, no rhonchi, no rales  Cardiovascular: S1/S2 normal, no murmur, no rub/gallop auscultated. RRR.   Musculoskeletal: Gait normal. Symmetric and independent movement of all extremities  Neurological: Normal balance/coordination. No tremor.  Skin: warm, dry. Rash is nontender. Excoriated. Skin is hairless. Pulses palpable PT, cap refill <2 sec.   Psychiatric: Normal judgment/insight. Normal mood and affect. Oriented x3.   Visit summary with medication list and pertinent instructions was printed for patient to review, advised to alert [place] if any changes needed. All questions at time of visit were answered - patient instructed to contact office with any additional concerns. ER/RTC precautions were reviewed with the patient and understanding verbalized.   Follow-up plan: Return for recheck skin if needed in 2 weeks. Recommend annual physical with PCP (needs to establish) .    Please note: voice recognition software was used to produce this  document, and typos may escape review. Please contact Dr. Lyn Hollingshead for any needed clarifications.

## 2018-12-29 NOTE — Patient Instructions (Addendum)
Will try cream for rash. If rash is not getting better in 2 weeks, come see [place]!  I'm concerned about possible circulation problem, or this may be a skin irritation from something else.     If you have lab work done today you will be contacted with your lab results within the next 2 weeks.  If you have not heard from [place] then please contact us. The fastest way to get your results is to register for My Chart.   IF you received an x-ray today, you will receive an invoice from [facility]. Please contact [facility] at [phone] with questions or concerns regarding your invoice.   IF you received labwork today, you will receive an invoice from [facility]. Please contact [facility] at [phone] with questions or concerns regarding your invoice.   Our billing staff will not be able to assist you with questions regarding bills from these companies.  You will be contacted with the lab results as soon as they are available. The fastest way to get your results is to activate your My Chart account. Instructions are located on the last page of this paperwork. If you have not heard from [place] regarding the results in 2 weeks, please contact this office.

## 2019-01-17 ENCOUNTER — Encounter: Payer: Self-pay | Admitting: Osteopathic Medicine

## 2019-01-31 ENCOUNTER — Encounter (HOSPITAL_COMMUNITY): Payer: Self-pay

## 2019-01-31 ENCOUNTER — Ambulatory Visit (HOSPITAL_COMMUNITY)
Admission: EM | Admit: 2019-01-31 | Discharge: 2019-01-31 | Disposition: A | Discharge status: Home / Self Care | Payer: Commercial Managed Care - PPO | Attending: Family Medicine | Admitting: Family Medicine

## 2019-01-31 DIAGNOSIS — B9789 Other viral agents as the cause of diseases classified elsewhere: Secondary | ICD-10-CM

## 2019-01-31 DIAGNOSIS — J069 Acute upper respiratory infection, unspecified: Secondary | ICD-10-CM | POA: Diagnosis not present

## 2019-01-31 MED ORDER — FLUTICASONE PROPIONATE 50 MCG/ACT NA SUSP: 1.0000 | Freq: Every day | NASAL | 2 refills | Status: AC

## 2019-01-31 MED ORDER — GUAIFENESIN ER 600 MG PO TB12: 600.0000 mg | ORAL_TABLET | Freq: Two times a day (BID) | ORAL | 0 refills | Status: AC

## 2019-01-31 MED ORDER — BENZONATATE 100 MG PO CAPS: 100.0000 mg | ORAL_CAPSULE | Freq: Three times a day (TID) | ORAL | 0 refills | Status: AC

## 2019-01-31 NOTE — ED Triage Notes (Signed)
Pt presents with upper respiratory cold; non productive cough, nasal drainage, and congestion since yesterday.

## 2019-01-31 NOTE — Discharge Instructions (Addendum)
This is most likely viral illness  Sending some medications to the pharmacy to help with the symptoms.  Follow up as needed for continued or worsening symptoms

## 2019-01-31 NOTE — ED Provider Notes (Signed)
[facility]    CSN: [id number] Arrival date & time: 01/31/19  8088     History   Chief Complaint Chief Complaint  Patient presents with  . URI    HPI Blake Wallace is a 68 y.o. male.    URI  Presenting symptoms: congestion, cough and rhinorrhea   Severity:  Moderate Onset quality:  Sudden Duration:  1 day Timing:  Constant Progression:  Unchanged Chronicity:  New Relieved by:  Nothing Worsened by:  Nothing Ineffective treatments:  OTC medications (Delsym cough syrup) Associated symptoms: myalgias   Risk factors: sick contacts   Risk factors: not elderly, no chronic cardiac disease, no chronic kidney disease, no chronic respiratory disease, no diabetes mellitus, no immunosuppression, no recent illness and no recent travel     History reviewed. No pertinent past medical history.  There are no active problems to display for this patient.   Past Surgical History:  Procedure Laterality Date  . BRAIN SURGERY         Home Medications    Prior to Admission medications   Medication Sig Start Date End Date Taking? Authorizing Provider  acetaminophen (TYLENOL) 650 MG CR tablet Take 650 mg by mouth every 8 (eight) hours as needed for pain.    [provider]  benzonatate (TESSALON) 100 MG capsule Take 1 capsule (100 mg total) by mouth every 8 (eight) hours. 01/31/19   Dahlia Byes A, NP  fluticasone (FLONASE) 50 MCG/ACT nasal spray Place 1 spray into both nostrils daily. 01/31/19   Dahlia Byes A, NP  guaiFENesin (MUCINEX) 600 MG 12 hr tablet Take 1 tablet (600 mg total) by mouth 2 (two) times daily. 01/31/19   Dahlia Byes A, NP  ipratropium (ATROVENT) 0.03 % nasal spray Place 2 sprays into the nose 4 (four) times daily. Patient not taking: Reported on 12/29/2018 01/14/18   Sherren Mocha, MD  triamcinolone cream (KENALOG) 0.1 % Apply 1 application topically 2 (two) times daily. To affected area(s) as needed, up to 2 weeks. 12/29/18   Sunnie Nielsen, DO     Family History History reviewed. No pertinent family history.  Social History Social History   Tobacco Use  . Smoking status: Never Smoker  . Smokeless tobacco: Never Used  Substance Use Topics  . Alcohol use: No    Alcohol/week: 0.0 standard drinks  . Drug use: No     Allergies   Patient has no known allergies.   Review of Systems Review of Systems  HENT: Positive for congestion and rhinorrhea.   Respiratory: Positive for cough.   Musculoskeletal: Positive for myalgias.     Physical Exam Triage Vital Signs ED Triage Vitals  Enc Vitals Group     BP 01/31/19 1011 (!) 160/92     Pulse Rate 01/31/19 1011 73     Resp 01/31/19 1011 17     Temp 01/31/19 1011 98.9 F (37.2 C)     Temp Source 01/31/19 1011 Oral     SpO2 01/31/19 1011 100 %     Weight --      Height --      Head Circumference --      Peak Flow --      Pain Score 01/31/19 1012 3     Pain Loc --      Pain Edu? --      Excl. in GC? --    No data found.  Updated Vital Signs BP (!) 160/92 (BP Location: Right Arm)   Pulse  73   Temp 98.9 F (37.2 C) (Oral)   Resp 17   SpO2 100%   Visual Acuity Right Eye Distance:   Left Eye Distance:   Bilateral Distance:    Right Eye Near:   Left Eye Near:    Bilateral Near:     Physical Exam Vitals signs and nursing note reviewed.  Constitutional:      General: He is not in acute distress.    Appearance: Normal appearance. He is well-developed. He is not ill-appearing, toxic-appearing or diaphoretic.  HENT:     Head: Normocephalic and atraumatic.     Right Ear: Tympanic membrane and ear canal normal.     Left Ear: Tympanic membrane and ear canal normal.     Nose: Congestion and rhinorrhea present.     Mouth/Throat:     Pharynx: Oropharynx is clear.  Eyes:     Conjunctiva/sclera: Conjunctivae normal.  Neck:     Musculoskeletal: Neck supple.  Cardiovascular:     Rate and Rhythm: Normal rate and regular rhythm.     Heart sounds: No murmur.   Pulmonary:     Effort: Pulmonary effort is normal. No respiratory distress.     Breath sounds: Normal breath sounds.  Musculoskeletal: Normal range of motion.  Skin:    General: Skin is warm and dry.     Findings: No rash.  Neurological:     Mental Status: He is alert.  Psychiatric:        Mood and Affect: Mood normal.      UC Treatments / Results  Labs (all labs ordered are listed, but only abnormal results are displayed) Labs Reviewed - No data to display  EKG None  Radiology No results found.  Procedures Procedures (including critical care time)  Medications Ordered in UC Medications - No data to display  Initial Impression / Assessment and Plan / UC Course  I have reviewed the triage vital signs and the nursing notes.  Pertinent labs & imaging results that were available during my care of the patient were reviewed by me and considered in my medical decision making (see chart for details).     Symptoms consistent with a viral illness Symptomatic treatment Medication sent to the pharmacy Follow up as needed for continued or worsening symptoms  Final Clinical Impressions(s) / UC Diagnoses   Final diagnoses:  Viral URI with cough     Discharge Instructions     This is most likely viral illness  Sending some medications to the pharmacy to help with the symptoms.  Follow up as needed for continued or worsening symptoms     ED Prescriptions    Medication Sig Dispense Auth. Provider   benzonatate (TESSALON) 100 MG capsule Take 1 capsule (100 mg total) by mouth every 8 (eight) hours. 21 capsule Ivan Maskell A, NP   fluticasone (FLONASE) 50 MCG/ACT nasal spray Place 1 spray into both nostrils daily. 16 g Marguita Venning A, NP   guaiFENesin (MUCINEX) 600 MG 12 hr tablet Take 1 tablet (600 mg total) by mouth 2 (two) times daily. 14 tablet Dahlia Byes A, NP     Controlled Substance Prescriptions [place] Controlled Substance Registry consulted? Not Applicable    Janace Aris, NP 01/31/19 1052

## 2020-02-07 ENCOUNTER — Ambulatory Visit: Payer: Self-pay | Attending: Internal Medicine

## 2020-02-07 DIAGNOSIS — Z23 Encounter for immunization: Secondary | ICD-10-CM | POA: Insufficient documentation

## 2020-02-07 NOTE — Progress Notes (Signed)
   Covid-19 Vaccination Clinic  Name:  Blake Wallace    MRN: [id number] DOB: 1951/01/28  02/07/2020  Blake Wallace was observed post Covid-19 immunization for 15 minutes without incident. He was provided with Vaccine Information Sheet and instruction to access the V-Safe system.   Blake Wallace was instructed to call 911 with any severe reactions post vaccine: [name] Difficulty breathing  . Swelling of face and throat  . A fast heartbeat  . A bad rash all over body  . Dizziness and weakness   Immunizations Administered    Name Date Dose VIS Date Route   Pfizer COVID-19 Vaccine 02/07/2020 10:44 AM 0.3 mL 11/16/2019 Intramuscular   Manufacturer: [employer], [employer]   Lot: [id number]   NDC: [address]-8

## 2020-03-04 ENCOUNTER — Ambulatory Visit: Payer: Self-pay | Attending: Internal Medicine

## 2020-03-04 DIAGNOSIS — Z23 Encounter for immunization: Secondary | ICD-10-CM

## 2020-03-04 NOTE — Progress Notes (Signed)
   Covid-19 Vaccination Clinic  Name:  Rik Wadel    MRN: [id number] DOB: [birth year]  03/04/2020  Mr. Crevier was observed post Covid-19 immunization for 15 minutes without incident. He was provided with Vaccine Information Sheet and instruction to access the V-Safe system.   Mr. Matheny was instructed to call 911 with any severe reactions post vaccine: [name] Difficulty breathing  . Swelling of face and throat  . A fast heartbeat  . A bad rash all over body  . Dizziness and weakness   Immunizations Administered    Name Date Dose VIS Date Route   Pfizer COVID-19 Vaccine 03/04/2020  2:31 PM 0.3 mL 11/16/2019 Intramuscular   Manufacturer: [employer], [employer]   Lot: [id number]   NDC: [address]-7

## 2023-06-30 DIAGNOSIS — H5203 Hypermetropia, bilateral: Secondary | ICD-10-CM | POA: Diagnosis not present

## 2023-06-30 DIAGNOSIS — H2513 Age-related nuclear cataract, bilateral: Secondary | ICD-10-CM | POA: Diagnosis not present

## 2023-06-30 DIAGNOSIS — H524 Presbyopia: Secondary | ICD-10-CM | POA: Diagnosis not present

## 2023-06-30 DIAGNOSIS — Z135 Encounter for screening for eye and ear disorders: Secondary | ICD-10-CM | POA: Diagnosis not present

## 2023-06-30 DIAGNOSIS — H52223 Regular astigmatism, bilateral: Secondary | ICD-10-CM | POA: Diagnosis not present

## 2023-08-29 DIAGNOSIS — Z13220 Encounter for screening for lipoid disorders: Secondary | ICD-10-CM | POA: Diagnosis not present

## 2023-08-29 DIAGNOSIS — Z136 Encounter for screening for cardiovascular disorders: Secondary | ICD-10-CM | POA: Diagnosis not present

## 2023-08-29 DIAGNOSIS — Z0000 Encounter for general adult medical examination without abnormal findings: Secondary | ICD-10-CM | POA: Diagnosis not present

## 2023-08-29 DIAGNOSIS — R030 Elevated blood-pressure reading, without diagnosis of hypertension: Secondary | ICD-10-CM | POA: Diagnosis not present

## 2023-08-29 DIAGNOSIS — Z1211 Encounter for screening for malignant neoplasm of colon: Secondary | ICD-10-CM | POA: Diagnosis not present

## 2023-08-29 DIAGNOSIS — R351 Nocturia: Secondary | ICD-10-CM | POA: Diagnosis not present

## 2023-10-28 DIAGNOSIS — E785 Hyperlipidemia, unspecified: Secondary | ICD-10-CM | POA: Diagnosis not present

## 2024-07-30 DIAGNOSIS — Z135 Encounter for screening for eye and ear disorders: Secondary | ICD-10-CM | POA: Diagnosis not present

## 2024-07-30 DIAGNOSIS — H25813 Combined forms of age-related cataract, bilateral: Secondary | ICD-10-CM | POA: Diagnosis not present

## 2024-07-30 DIAGNOSIS — H524 Presbyopia: Secondary | ICD-10-CM | POA: Diagnosis not present

## 2024-09-05 DIAGNOSIS — E785 Hyperlipidemia, unspecified: Secondary | ICD-10-CM | POA: Diagnosis not present

## 2024-09-05 DIAGNOSIS — R972 Elevated prostate specific antigen [PSA]: Secondary | ICD-10-CM | POA: Diagnosis not present

## 2024-09-05 DIAGNOSIS — I10 Essential (primary) hypertension: Secondary | ICD-10-CM | POA: Diagnosis not present

## 2024-09-05 DIAGNOSIS — Z0000 Encounter for general adult medical examination without abnormal findings: Secondary | ICD-10-CM | POA: Diagnosis not present

## 2024-09-05 DIAGNOSIS — Z23 Encounter for immunization: Secondary | ICD-10-CM | POA: Diagnosis not present

## 2024-09-05 DIAGNOSIS — Z1211 Encounter for screening for malignant neoplasm of colon: Secondary | ICD-10-CM | POA: Diagnosis not present

## 2024-09-07 DIAGNOSIS — Z1211 Encounter for screening for malignant neoplasm of colon: Secondary | ICD-10-CM | POA: Diagnosis not present

## 2024-09-26 DIAGNOSIS — I10 Essential (primary) hypertension: Secondary | ICD-10-CM | POA: Diagnosis not present
